# Patient Record
Sex: Female | Born: 1980 | Race: White | Hispanic: No | Marital: Married | Smoking: Never smoker
Health system: Southern US, Community
[De-identification: ages and names within clinical notes are randomized; demographics above are authoritative.]

## PROBLEM LIST (undated history)

## (undated) DIAGNOSIS — M1990 Unspecified osteoarthritis, unspecified site: Secondary | ICD-10-CM

## (undated) DIAGNOSIS — R059 Cough, unspecified: Secondary | ICD-10-CM

## (undated) DIAGNOSIS — E079 Disorder of thyroid, unspecified: Secondary | ICD-10-CM

## (undated) DIAGNOSIS — Z8719 Personal history of other diseases of the digestive system: Secondary | ICD-10-CM

## (undated) DIAGNOSIS — R05 Cough: Secondary | ICD-10-CM

## (undated) HISTORY — DX: Cough, unspecified: R05.9

## (undated) HISTORY — PX: ESOPHAGOGASTRODUODENOSCOPY: SHX1529

## (undated) HISTORY — DX: Personal history of other diseases of the digestive system: Z87.19

## (undated) HISTORY — DX: Cough: R05

## (undated) HISTORY — PX: KNEE SURGERY: SHX244

## (undated) HISTORY — DX: Disorder of thyroid, unspecified: E07.9

## (undated) HISTORY — DX: Unspecified osteoarthritis, unspecified site: M19.90

---
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
## 2003-02-03 ENCOUNTER — Encounter: Payer: Self-pay | Admitting: Pulmonary Disease

## 2004-06-13 ENCOUNTER — Ambulatory Visit: Payer: Self-pay | Admitting: Pulmonary Disease

## 2005-09-07 ENCOUNTER — Encounter: Admission: RE | Admit: 2005-09-07 | Discharge: 2005-09-07 | Payer: Self-pay | Admitting: Family Medicine

## 2006-01-26 ENCOUNTER — Ambulatory Visit: Payer: Self-pay | Admitting: Pulmonary Disease

## 2006-05-23 ENCOUNTER — Ambulatory Visit: Payer: Self-pay | Admitting: Sports Medicine

## 2007-03-26 DIAGNOSIS — J45909 Unspecified asthma, uncomplicated: Secondary | ICD-10-CM | POA: Insufficient documentation

## 2007-03-26 DIAGNOSIS — R059 Cough, unspecified: Secondary | ICD-10-CM | POA: Insufficient documentation

## 2007-03-26 DIAGNOSIS — R05 Cough: Secondary | ICD-10-CM | POA: Insufficient documentation

## 2007-03-27 ENCOUNTER — Ambulatory Visit: Payer: Self-pay | Admitting: Pulmonary Disease

## 2007-04-18 ENCOUNTER — Encounter: Admission: RE | Admit: 2007-04-18 | Discharge: 2007-04-18 | Payer: Self-pay | Admitting: Gastroenterology

## 2007-05-07 ENCOUNTER — Telehealth: Payer: Self-pay | Admitting: Pulmonary Disease

## 2007-06-04 ENCOUNTER — Encounter (INDEPENDENT_AMBULATORY_CARE_PROVIDER_SITE_OTHER): Payer: Self-pay | Admitting: General Surgery

## 2007-06-04 ENCOUNTER — Ambulatory Visit (HOSPITAL_COMMUNITY): Admission: RE | Admit: 2007-06-04 | Discharge: 2007-06-05 | Payer: Self-pay | Admitting: General Surgery

## 2008-05-08 HISTORY — PX: CHOLECYSTECTOMY: SHX55

## 2008-07-27 ENCOUNTER — Ambulatory Visit: Payer: Self-pay | Admitting: Pulmonary Disease

## 2008-07-30 ENCOUNTER — Encounter: Payer: Self-pay | Admitting: Pulmonary Disease

## 2009-07-08 ENCOUNTER — Telehealth (INDEPENDENT_AMBULATORY_CARE_PROVIDER_SITE_OTHER): Payer: Self-pay | Admitting: *Deleted

## 2009-07-15 ENCOUNTER — Telehealth (INDEPENDENT_AMBULATORY_CARE_PROVIDER_SITE_OTHER): Payer: Self-pay | Admitting: *Deleted

## 2009-07-19 ENCOUNTER — Encounter: Payer: Self-pay | Admitting: Pulmonary Disease

## 2009-07-27 ENCOUNTER — Ambulatory Visit: Payer: Self-pay | Admitting: Pulmonary Disease

## 2009-07-27 DIAGNOSIS — J309 Allergic rhinitis, unspecified: Secondary | ICD-10-CM | POA: Insufficient documentation

## 2010-02-14 ENCOUNTER — Telehealth (INDEPENDENT_AMBULATORY_CARE_PROVIDER_SITE_OTHER): Payer: Self-pay | Admitting: *Deleted

## 2010-02-24 ENCOUNTER — Telehealth (INDEPENDENT_AMBULATORY_CARE_PROVIDER_SITE_OTHER): Payer: Self-pay | Admitting: *Deleted

## 2010-06-07 NOTE — Procedures (Signed)
Summary: [facility] [employer]   Imported By: Sherian Rein 07/28/2008 08:55:33  _____________________________________________________________________  External Attachment:    Type:   Image     Comment:   External Document

## 2010-06-07 NOTE — Progress Notes (Signed)
Summary: NEED PRESCRIPT  Phone Note Call from Patient Call back at [phone]   Caller: Patient Call For: [facility] Summary of Call: NEED PRESCRIPT FOR [facility] AND PROVENTIL SENT TO [facility] PATIENT'S CHART HAS BEEN REQUESTED    Initial call taken by: Rickard Patience,  May 07, 2007 11:09 AM  Follow-up for Phone Call        Pt seen 03/27/07.  KC changed advair to symbicort 160/4.5mg . 90 day scripts done awaiting KC signature. Follow-up by: Cloyde Reams RN,  May 07, 2007 4:53 PM  Additional Follow-up for Phone Call Additional follow up Details #1::        Rx sent to [facility] Called pt LMOM rx done. Additional Follow-up by: Cloyde Reams RN,  May 07, 2007 5:28 PM    New/Updated Medications: PROVENTIL HFA 108 (90 BASE) MCG/ACT  AERS (ALBUTEROL SULFATE) Inhale 2 puffs every 4-6 hrs as needed SYMBICORT 160-4.5 MCG/ACT  AERO (BUDESONIDE-FORMOTEROL FUMARATE) Inhale 2 puffs two times a day   Prescriptions: SYMBICORT 160-4.5 MCG/ACT  AERO (BUDESONIDE-FORMOTEROL FUMARATE) Inhale 2 puffs two times a day  #3 x 3   Entered by:   Cloyde Reams RN   Authorized by:   Barbaraann Share MD   Signed by:   Cloyde Reams RN on 05/07/2007   Method used:   Print then Give to Patient   RxID:   [id number] PROVENTIL HFA 108 (90 BASE) MCG/ACT  AERS (ALBUTEROL SULFATE) Inhale 2 puffs every 4-6 hrs as needed  #3 x 3   Entered by:   Cloyde Reams RN   Authorized by:   Barbaraann Share MD   Signed by:   Cloyde Reams RN on 05/07/2007   Method used:   Print then Give to Patient   RxID:   [id number]

## 2010-06-07 NOTE — Assessment & Plan Note (Signed)
Summary: rov for asthma   CC:  Pt is here for a 1 yr f/u appt.  Pt states breathing unchanged from last year.  Pt denied any new complaints. .  History of Present Illness: the pt comes in today for f/u of her known asthma.  She is doing very well on her current regimen, and has had no acute exacerbations.  She denies any need for her rescue inhaler, and is staying active with swimming.  Her allergies always act up this time of year, but she is doing ok on allegra.  Current Medications (verified): 1)  Allegra 180 Mg  Tabs (Fexofenadine Hcl) .... Take One Tab By Mouth Once Daily 2)  Proventil Hfa 108 (90 Base) Mcg/act  Aers (Albuterol Sulfate) .... Inhale 2 Puffs Every 4-6 Hrs As Needed 3)  Symbicort 160-4.5 Mcg/act  Aero (Budesonide-Formoterol Fumarate) .... Inhale 2 Puffs Two Times A Day 4)  Prenatal Multivitamin .... Take 1 Tablet By Mouth Once A Day  Allergies (verified): No Known Drug Allergies  Review of Systems      See HPI  Vital Signs:  Patient profile:   30 year old female Weight:      178 pounds O2 Sat:      97 % on Room air Temp:     98.1 degrees F oral Pulse rate:   66 / minute BP sitting:   112 / 68  (left arm) Cuff size:   regular  Vitals Entered By: Arman Filter LPN (July 27, 2009 8:57 AM)  O2 Flow:  Room air CC: Pt is here for a 1 yr f/u appt.  Pt states breathing unchanged from last year.  Pt denied any new complaints.  Comments Medications reviewed with patient Arman Filter LPN  July 27, 2009 8:57 AM    Physical Exam  General:  wd female in nad Lungs:  totally clear to auscultation, no wheezing or rhonchi Heart:  rrr, no mrg   Impression & Recommendations:  Problem # 1:  ASTHMA (ICD-493.90) the pt is doing well on her current regimen.  No changes at this time  Problem # 2:  ALLERGIC RHINITIS (ICD-477.9)  having increased symptoms this time of year.  Stay on allegra, and can use neilmed sinus rinses as needed.  Medications Added to  Medication List This Visit: 1)  Prenatal Multivitamin  .... Take 1 tablet by mouth once a day  Other Orders: Est. Patient Level II (16109)  Patient Instructions: 1)  no change in meds 2)  followup with me in one year.   Immunization History:  Influenza Immunization History:    Influenza:  historical (02/05/2009)

## 2010-06-07 NOTE — Assessment & Plan Note (Signed)
Summary: rov for asthma   CC:  Pt is here for a f/u appt. Pt was last seen Nov 2008.  Pt states breathing is overall ok.  Pt c/o increased sob while talking.  Pt needs rx for Symbicort and Proventil and Allegra.  .  History of Present Illness: the pt comes in today for f/u of her asthma.  She has been doing well overall, with no recent exacerbations or increased rescue inhaler use.  Her only complaint is that of dyspnea with prolonged conversation.  Her mother will sometime notice this when they are talking on the phone.  She isn't aware whether she is holding her breath during this time.  No significant cough or mucus production.  Medications Prior to Update: 1)  Ortho Evra 150-20 Mcg/24hr  Ptwk (Norelgestromin-Eth Estradiol) .... Take Once Daily 2)  Allegra 180 Mg  Tabs (Fexofenadine Hcl) .... Take One Tab By Mouth Once Daily 3)  Proventil Hfa 108 (90 Base) Mcg/act  Aers (Albuterol Sulfate) .... Inhale 2 Puffs Every 4-6 Hrs As Needed 4)  Symbicort 160-4.5 Mcg/act  Aero (Budesonide-Formoterol Fumarate) .... Inhale 2 Puffs Two Times A Day  Allergies (verified): No Known Drug Allergies  Review of Systems      See HPI  Vital Signs:  Patient profile:   30 year old female Weight:      190.25 pounds BMI:     25.90 O2 Sat:      99 % Temp:     97.7 degrees F oral Pulse rate:   77 / minute BP sitting:   128 / 76  (left arm) Cuff size:   regular  Vitals Entered By: Arman Filter LPN (July 27, 2008 3:46 PM)  O2 Sat on room air at rest %:  99 CC: Pt is here for a f/u appt. Pt was last seen Nov 2008.  Pt states breathing is overall ok.  Pt c/o increased sob while talking.  Pt needs rx for Symbicort, Proventil and Allegra.   Comments Medications reviewed with patient Arman Filter LPN  July 27, 2008 3:56 PM    Physical Exam  General:  wd female in nad Lungs:  clear to auscultation, no wheezing or rhonchi Heart:  rrr, no mrg   Impression & Recommendations:  Problem # 1:  ASTHMA  (ICD-493.90) the pt is doing well from an asthma standpoint.  Her spirometry is normal, and unchanged from 2004.  She exercises regularly, and is getting ready to participate in a triatholon in april.  I suspect her "being winded" with prolonged conversation is not an issue, but I have asked her to monitor to see if worsens.  Complete Medication List: 1)  Ortho Evra 150-20 Mcg/24hr Ptwk (Norelgestromin-eth estradiol) .... Take once daily 2)  Allegra 180 Mg Tabs (Fexofenadine hcl) .... Take one tab by mouth once daily 3)  Proventil Hfa 108 (90 Base) Mcg/act Aers (Albuterol sulfate) .... Inhale 2 puffs every 4-6 hrs as needed 4)  Symbicort 160-4.5 Mcg/act Aero (Budesonide-formoterol fumarate) .... Inhale 2 puffs two times a day  Other Orders: Est. Patient Level II (60454) Spirometry w/Graph (09811)  Patient Instructions: 1)  no change in your pulmonary meds. 2)  follow up with me in one year, but call if issues arise.   Prescriptions: PROVENTIL HFA 108 (90 BASE) MCG/ACT  AERS (ALBUTEROL SULFATE) Inhale 2 puffs every 4-6 hrs as needed  #3 x 6   Entered and Authorized by:   Barbaraann Share MD   Signed by:  Barbaraann Share MD on 07/27/2008   Method used:   Print then Give to Patient   RxID:   [id number] SYMBICORT 160-4.5 MCG/ACT  AERO (BUDESONIDE-FORMOTEROL FUMARATE) Inhale 2 puffs two times a day  #3 x 6   Entered and Authorized by:   Barbaraann Share MD   Signed by:   Barbaraann Share MD on 07/27/2008   Method used:   Print then Give to Patient   RxID:   [id number]   Appended Document: rov for asthma     Allergies: No Known Drug Allergies   Complete Medication List: 1)  Ortho Evra 150-20 Mcg/24hr Ptwk (Norelgestromin-eth estradiol) .... Take once daily 2)  Allegra 180 Mg Tabs (Fexofenadine hcl) .... Take one tab by mouth once daily 3)  Proventil Hfa 108 (90 Base) Mcg/act Aers (Albuterol sulfate) .... Inhale 2 puffs every 4-6 hrs as needed 4)  Symbicort 160-4.5 Mcg/act  Aero (Budesonide-formoterol fumarate) .... Inhale 2 puffs two times a day   Pulmonary Function Test Date: 07/27/2008 Gender: Female  Pre-Spirometry FVC    Value: 5.41 L/min   Pred: 4.44 L/min     % Pred: 121 % FEV1    Value: 4.54 L     Pred: 3.70 L     % Pred: 122 % FEV1/FVC  Value: 84 %     Evaluation: normal

## 2010-06-07 NOTE — Progress Notes (Signed)
Summary: fax coming/ rx/ [facility]-LMTCBx 1   Phone Note Call from Patient   Caller: Patient Call For: clance Summary of Call: pt is faxing a form (to triage fax) for rx FEXOFENADINE HCL. she needs this to be faxed back to [facility] re: pt's continuation of this med. pt's cell [phone] Initial call taken by: Tivis Ringer, CNA,  July 08, 2009 11:04 AM  Follow-up for Phone Call        Spoke with pharmacist at [facility] and initiated PA renewal for fexofenadine 180 mg.  LMOMTCB Vernie Murders  July 08, 2009 11:48 AM  Spoke with pt and made aware that PA has been initiated and tht we just have to wait for [facility] to fill out and fax back. Follow-up by: Vernie Murders,  July 08, 2009 12:01 PM

## 2010-06-07 NOTE — Progress Notes (Signed)
Summary: rx for allegra 90 day supply to [facility]   Phone Note Call from Patient Call back at Work Phone [phone]   Caller: Patient Call For: clance Reason for Call: Refill Medication, Talk to Nurse Summary of Call: Patient needs refill--allegra 180mg --90 day supply--[facility] mail order. Initial call taken by: Lehman Prom,  February 24, 2010 2:23 PM  Follow-up for Phone Call        pt was last seen by [facility] 07/27/2009 and was told to f/u in 1 year.  Pt does have pending appt scheduled for 07/18/2010. Therefore rx sent to [facility].  Pt aware.  Aundra Millet Reynolds LPN  February 24, 2010 3:34 PM     Prescriptions: ALLEGRA 180 MG  TABS (FEXOFENADINE HCL) take one tab by mouth once daily  #90 x 1   Entered by:   Arman Filter LPN   Authorized by:   Barbaraann Share MD   Signed by:   Arman Filter LPN on 62/95/2841   Method used:   Telephoned to ...       [facility] (retail)             ,          Ph: [phone]       Fax: [phone]   RxID:   [id number]

## 2010-06-07 NOTE — Medication Information (Signed)
Summary: Tax adviser   Imported By: Lehman Prom 07/19/2009 16:39:52  _____________________________________________________________________  External Attachment:    Type:   Image     Comment:   External Document

## 2010-06-07 NOTE — Progress Notes (Signed)
Summary: asthma meds/ pregnancy  Phone Note Call from Patient   Caller: Patient Call For: clance Summary of Call: pt believes she is pregnant. stopped taking her asthma meds 3 wks ago. wants to know if it's "safe" to take before resuming meds. [phone] Initial call taken by: Tivis Ringer, CNA,  February 14, 2010 3:09 PM  Follow-up for Phone Call        Called number provied above - [facility] Crystal Yetta Barre RN  February 14, 2010 4:10 PM  Patient is [redacted] weeks pregnant and has stopped her Symbicort and Proventil. Please advise on inhaler use while pregnant. Patient can be reached at [phone] after 5pm.Lori Excell Seltzer [facility]  February 14, 2010 4:35 PM   Additional Follow-up for Phone Call Additional follow up Details #1::        it is ok to stay on her symbicort and albuterol both need to check with obgyn to be sure they are ok with it...but this is not a problem the majority of the time.  Will be important to keep asthma controlled during pregnancy Additional Follow-up by: Barbaraann Share MD,  February 14, 2010 4:39 PM    Additional Follow-up for Phone Call Additional follow up Details #2::    Called, spoke with pt.  She was informed of above per [facility] and verbalized understanding.   Follow-up by: Gweneth Dimitri RN,  February 14, 2010 4:42 PM

## 2010-06-07 NOTE — Progress Notes (Signed)
Summary: fexofenadine PA - LMTCB x 1  Phone Note Outgoing Call   Call placed by: Vernie Murders,  July 15, 2009 4:21 PM Call placed to: Patient Summary of Call: PA for fexofenadine needs to be filled out.  Per KC- we need to call the pt and ask her the ?'s on the PA form (form in in triage).  Per KC if she does not meet criteria, tell pt that her insurance is refusing to pay.  ATC pt LMOMTCB   Will forward to MR per TD Vernie Murders  July 15, 2009 4:30 PM  Initial call taken by: Vernie Murders,  July 15, 2009 4:23 PM  Follow-up for Phone Call        [facility].  Arman Filter LPN  July 15, 2009 4:44 PM   spoke with pt.  completed prior auth form and faxed back to company.   Aundra Millet Reynolds LPN  July 16, 2009 10:23 AM

## 2010-06-07 NOTE — Medication Information (Signed)
Summary: Tax adviser   Imported By: Lehman Prom 07/30/2008 17:19:07  _____________________________________________________________________  External Attachment:    Type:   Image     Comment:   External Document

## 2010-07-10 ENCOUNTER — Emergency Department (HOSPITAL_COMMUNITY): Payer: BC Managed Care – PPO

## 2010-07-10 ENCOUNTER — Emergency Department (HOSPITAL_COMMUNITY)
Admission: EM | Admit: 2010-07-10 | Discharge: 2010-07-10 | Disposition: A | Discharge status: Home / Self Care | Payer: BC Managed Care – PPO | Attending: Emergency Medicine | Admitting: Emergency Medicine

## 2010-07-10 DIAGNOSIS — W010XXA Fall on same level from slipping, tripping and stumbling without subsequent striking against object, initial encounter: Secondary | ICD-10-CM | POA: Insufficient documentation

## 2010-07-10 DIAGNOSIS — S8010XA Contusion of unspecified lower leg, initial encounter: Secondary | ICD-10-CM | POA: Insufficient documentation

## 2010-07-10 DIAGNOSIS — O99891 Other specified diseases and conditions complicating pregnancy: Secondary | ICD-10-CM | POA: Insufficient documentation

## 2010-07-10 DIAGNOSIS — IMO0002 Reserved for concepts with insufficient information to code with codable children: Secondary | ICD-10-CM | POA: Insufficient documentation

## 2010-07-18 ENCOUNTER — Ambulatory Visit (INDEPENDENT_AMBULATORY_CARE_PROVIDER_SITE_OTHER): Payer: BC Managed Care – PPO | Admitting: Pulmonary Disease

## 2010-07-18 ENCOUNTER — Encounter: Payer: Self-pay | Admitting: Pulmonary Disease

## 2010-07-18 DIAGNOSIS — J45909 Unspecified asthma, uncomplicated: Secondary | ICD-10-CM

## 2010-07-19 ENCOUNTER — Telehealth: Payer: Self-pay | Admitting: Pulmonary Disease

## 2010-07-26 NOTE — Progress Notes (Signed)
Summary: QVAR rx to [facility]  Phone Note Call from Patient   Caller: Patient Call For: [facility] Summary of Call: pt was seen yesterday. she requests that the rx for QVAR be faxed to [facility] for 3 months supply. pt # [id number] Initial call taken by: Tivis Ringer, CNA,  July 19, 2010 9:47 AM  Follow-up for Phone Call        LMTCBx1 becasue pt was started on qvar 40 for a few weeks then she was to go to qvar , so I need to know iof she has samples of qvar 40 becasue we cannot send in 3 month supply of the the since she is only to take it for a few weeks. Carron Curie CMA  July 19, 2010 10:12 AM  pt has sample of qvar 40 and was told to use this until it is gone then go to qvar , so refill sent to [facility] for the pt. pt aware. Carron Curie CMA  July 19, 2010 10:44 AM     Prescriptions: QVAR 80 MCG/ACT  AERS (BECLOMETHASONE DIPROPIONATE) Two  puffs twice daily  #3 x 0   Entered by:   Carron Curie CMA   Authorized by:   Barbaraann Share MD   Signed by:   Carron Curie CMA on 07/19/2010   Method used:   Faxed to ...       [facility] (retail)             ,          Ph: [phone]       Fax: [phone]   RxID:   [id number]

## 2010-08-04 NOTE — Assessment & Plan Note (Signed)
Summary: rov for asthma   CC:  1 year f/u appt for asthma.  Pt is currently pregnant and due in May.   Pt states  she stopped taking Symbicort d/t the pregnancy.  Pt states she is currently getting over a "cold" - mostly non productive cough but will occ cough up white sputum. denies fever.  Marland Kitchen  History of Present Illness: the pt comes in today for f/u of her known asthma.  She has been doing very well on her usual regimen, but then became pregnant and stopped symbicort due to concerns about effects to her baby.  She had been doing well off meds, but recently developed a "cold" with persistent mild cough.  She has not had any acute exacerbations.    Preventive Screening-Counseling & Management  Alcohol-Tobacco     Smoking Status: never  Current Medications (verified): 1)  Allegra 180 Mg  Tabs (Fexofenadine Hcl) .... Take One Tab By Mouth Once Daily 2)  Proventil Hfa 108 (90 Base) Mcg/act  Aers (Albuterol Sulfate) .... Inhale 2 Puffs Every 4-6 Hrs As Needed 3)  Prenatal Multivitamin .... Take 1 Tablet By Mouth Once A Day 4)  Wellbutrin .... Once Daily  Allergies (verified): No Known Drug Allergies  Social History: Patient never smoked.  pt is currently pregnany and due in May.   Review of Systems       The patient complains of shortness of breath with activity, shortness of breath at rest, productive cough, non-productive cough, acid heartburn, indigestion, weight change, sore throat, nasal congestion/difficulty breathing through nose, hand/feet swelling, and joint stiffness or pain.  The patient denies coughing up blood, chest pain, irregular heartbeats, loss of appetite, abdominal pain, difficulty swallowing, tooth/dental problems, headaches, sneezing, itching, ear ache, anxiety, depression, rash, change in color of mucus, and fever.    Vital Signs:  Patient profile:   30 year old female Height:      72 inches Weight:      246.25 pounds BMI:     33.52 O2 Sat:      96 % on Room  air Temp:     98.1 degrees F oral Pulse rate:   101 / minute BP sitting:   118 / 76  (left arm) Cuff size:   regular  Vitals Entered By: Arman Filter LPN (July 18, 2010 9:37 AM)  O2 Flow:  Room air CC: 1 year f/u appt for asthma.  Pt is currently pregnant and due in May.   Pt states  she stopped taking Symbicort d/t the pregnancy.  Pt states she is currently getting over a "cold" - mostly non productive cough but will occ cough up white sputum. denies fever.   Comments Medications reviewed with patient Arman Filter LPN  July 18, 2010 9:37 AM    Physical Exam  General:  pregnant female in nad  Lungs:  clear to auscultation  Heart:  mild tachy but regular Extremities:  1+ edema bilat, no cyanosis  Neurologic:  alert and oriented, moves all 4    Impression & Recommendations:  Problem # 1:  ASTHMA (ICD-493.90) the pt has a history of asthma that has done very well in the past on symbicort.  She is now pregnant and concerned about the risk to her baby from the medication.  I have explained to her the medications have been shown to be safe during pregnancy, and that she must weigh the risk of the med vs the risk of having asthma exacerbations late in her pregnancy  or even worse during delivery.  I have convinced her to go back on her meds, but will change to ICS alone rather than combo therapy.  She is willing to do this.  Medications Added to Medication List This Visit: 1)  Wellbutrin  .... Once daily 2)  Qvar 80 Mcg/act Aers (Beclomethasone dipropionate) .... Two  puffs twice daily  Other Orders: Est. Patient Level III (16109)  Patient Instructions: 1)  ok to stay off symbicort 2)  will start you on low dose qvar 2 in am and pm for next few weeks, then start on the higher dose at same dose.  Keep mouth rinsed well.  3)  call if you are having to use your rescue inhaler more than 2-3 times a week 4)  will keep you on qvar for maintenance after delivery to see how you do.    5)  followup with me in one year.    Prescriptions: QVAR 80 MCG/ACT  AERS (BECLOMETHASONE DIPROPIONATE) Two  puffs twice daily  #1 x 11   Entered and Authorized by:   Barbaraann Share MD   Signed by:   Barbaraann Share MD on 07/18/2010   Method used:   Print then Give to Patient   RxID:   [id number]

## 2010-09-12 ENCOUNTER — Other Ambulatory Visit (HOSPITAL_COMMUNITY): Payer: Self-pay | Admitting: Obstetrics and Gynecology

## 2010-09-12 ENCOUNTER — Inpatient Hospital Stay (HOSPITAL_COMMUNITY)
Admission: AD | Admit: 2010-09-12 | Discharge: 2010-09-15 | DRG: 371 | Disposition: A | Discharge status: Home / Self Care | Payer: BC Managed Care – PPO | Source: Ambulatory Visit | Attending: Obstetrics and Gynecology | Admitting: Obstetrics and Gynecology

## 2010-09-12 ENCOUNTER — Inpatient Hospital Stay (HOSPITAL_COMMUNITY): Payer: BC Managed Care – PPO

## 2010-09-12 DIAGNOSIS — O321XX0 Maternal care for breech presentation, not applicable or unspecified: Principal | ICD-10-CM | POA: Diagnosis present

## 2010-09-12 LAB — COMPREHENSIVE METABOLIC PANEL
ALT: 8 U/L (ref 0–35)
AST: 11 U/L (ref 0–37)
Albumin: 2.5 g/dL — ABNORMAL LOW (ref 3.5–5.2)
Alkaline Phosphatase: 138 U/L — ABNORMAL HIGH (ref 39–117)
BUN: 11 mg/dL (ref 6–23)
CO2: 19 mEq/L (ref 19–32)
Calcium: 9.6 mg/dL (ref 8.4–10.5)
Chloride: 98 mEq/L (ref 96–112)
Creatinine, Ser: 0.64 mg/dL (ref 0.4–1.2)
GFR calc Af Amer: >60 mL/min (ref >60)
GFR calc non Af Amer: >60 mL/min (ref >60)
Glucose, Bld: 93 mg/dL (ref 70–99)
Potassium: 4 mEq/L (ref 3.5–5.1)
Sodium: 130 mEq/L — ABNORMAL LOW (ref 135–145)
Total Bilirubin: 0.2 mg/dL — ABNORMAL LOW (ref 0.3–1.2)
Total Protein: 6.8 g/dL (ref 6.0–8.3)

## 2010-09-12 LAB — URINALYSIS, ROUTINE W REFLEX MICROSCOPIC
Bilirubin Urine: NEGATIVE
Glucose, UA: NEGATIVE mg/dL
Hgb urine dipstick: NEGATIVE
Ketones, ur: NEGATIVE mg/dL
Nitrite: NEGATIVE
Protein, ur: NEGATIVE mg/dL
Specific Gravity, Urine: 1.02 (ref 1.005–1.030)
Urobilinogen, UA: 0.2 mg/dL (ref 0.0–1.0)
pH: 6 (ref 5.0–8.0)

## 2010-09-12 LAB — CBC
HCT: 38 % (ref 36.0–46.0)
Hemoglobin: 12.9 g/dL (ref 12.0–15.0)
MCH: 30.1 pg (ref 26.0–34.0)
MCHC: 33.9 g/dL (ref 30.0–36.0)
MCV: 88.8 fL (ref 78.0–100.0)
Platelets: 210 10*3/uL (ref 150–400)
RBC: 4.28 MIL/uL (ref 3.87–5.11)
RDW: 14 % (ref 11.5–15.5)
WBC: 16.3 10*3/uL — ABNORMAL HIGH (ref 4.0–10.5)

## 2010-09-12 LAB — URIC ACID: Uric Acid, Serum: 4.6 mg/dL (ref 2.4–7.0)

## 2010-09-13 ENCOUNTER — Other Ambulatory Visit (HOSPITAL_COMMUNITY): Payer: BC Managed Care – PPO

## 2010-09-13 LAB — CBC
HCT: 33 % — ABNORMAL LOW (ref 36.0–46.0)
Hemoglobin: 10.5 g/dL — ABNORMAL LOW (ref 12.0–15.0)
MCH: 29.2 pg (ref 26.0–34.0)
MCHC: 31.8 g/dL (ref 30.0–36.0)
MCV: 91.7 fL (ref 78.0–100.0)
Platelets: 172 10*3/uL (ref 150–400)
RBC: 3.6 MIL/uL — ABNORMAL LOW (ref 3.87–5.11)
RDW: 14.3 % (ref 11.5–15.5)
WBC: 9.5 10*3/uL (ref 4.0–10.5)

## 2010-09-13 LAB — RPR: RPR Ser Ql: NONREACTIVE

## 2010-09-17 ENCOUNTER — Inpatient Hospital Stay (HOSPITAL_COMMUNITY): Admission: AD | Admit: 2010-09-17 | Payer: Self-pay | Admitting: Obstetrics and Gynecology

## 2010-09-20 ENCOUNTER — Inpatient Hospital Stay (HOSPITAL_COMMUNITY)
Admission: RE | Admit: 2010-09-20 | Payer: BC Managed Care – PPO | Source: Ambulatory Visit | Admitting: Obstetrics and Gynecology

## 2010-09-20 NOTE — Op Note (Signed)
NAMEANDRIANA, CASA                ACCOUNT NO.:  [id number]   MEDICAL RECORD NO.:  [id number]          PATIENT TYPE:  AMB   LOCATION:  DAY                          FACILITY:  [facility]   PHYSICIAN:  Angelia Mould. Derrell Lolling, M.D.DATE OF BIRTH:  1980-05-29   DATE OF PROCEDURE:  06/04/2007  DATE OF DISCHARGE:                               OPERATIVE REPORT   PREOPERATIVE DIAGNOSIS:  Biliary dyskinesia.   POSTOPERATIVE DIAGNOSIS:  Biliary dyskinesia.   OPERATION PERFORMED:  Laparoscopic cholecystectomy with intraoperative  cholangiogram.   SURGEON:  Dr. Claud Kelp.   FIRST ASSISTANT:  Dr. Darnell Level.   OPERATIVE INDICATIONS:  This is a 30 year old white female who has had a  history of nocturnal abdominal pain since August 2008.  This happens two  or three times a week but usually at night after supper.  She has nausea  and vomiting sometimes.  The epigastric and right upper quadrant pain  has persisted.  She has tried proton pump inhibitors and that has not  helped.  She has had upper endoscopy which was normal.  A CLO-test was  negative.  Abdominal ultrasound was normal.  Hepatobiliary scan was  abnormal showing abnormally low ejection fraction at 22% at one hour.  Because of persistent chronic symptoms, she was referred for  consideration of cholecystectomy.   OPERATIVE FINDINGS:  The gallbladder was thin-walled, somewhat  discolored.  The cystic duct was very thin and narrow but was patent.  The cholangiogram was normal showing normal intrahepatic and  extrahepatic biliary anatomy, no filling defects and no obstruction with  good flow of contrast into the duodenum.  The stomach, duodenum, liver,  small intestine, large intestine looked grossly normal to inspection.   OPERATIVE TECHNIQUE:  Following induction of general endotracheal  anesthesia, the patient's abdomen was prepped and draped in sterile  fashion.  Patient was identified as to correct patient and correct  procedure.   Intravenous antibiotics were given.  0.5% Marcaine with  epinephrine used as local infiltration anesthetic.  A vertically  oriented incision was made inside the lower rim of the umbilicus.  The  fascia was incised in midline.  The abdominal cavity entered under  direct vision.  A 10 mm Hasson trocar was inserted and secured with  pursestring suture of 0-0 Vicryl.  Pneumoperitoneum was created.  Video  cam was inserted with visualization findings as described above.  A 10-  mm trocar was placed in the subxiphoid region, two 5 mm trocars placed  in the right midabdomen.  The gallbladder fundus was elevated.  The  infundibulum was identified and retracted laterally.  I dissected out  the cystic duct and cystic artery.  I isolated the cystic artery as it  went onto the wall of the gallbladder, secured with multiple metal clips  and divided it.  This created a large window behind the cystic duct.  The cholangiogram catheter was inserted into the cystic duct.  A  cholangiogram was obtained using the C-arm.  The cholangiogram was  normal as described above.  The cholangiogram catheter was removed.  The  cystic duct was  secured with multiple metal clips and divided.  The  gallbladder dissected from its bed with electrocautery and removed by  placing the specimen bag.  We did spill a little bit of bile from one  hole in the gallbladder but we irrigated all of this out.  At the  completion of the case all of the irrigation fluid was completely clear.  There was no bleeding and no bile be leak whatsoever.  We removed these  5 mm trocars from the right upper quadrant.  One of the trocar sites had  some bleeding and this was cauterized from inside the abdomen and  stopped.  We observed this for 5 minutes and there was no further  bleeding.  The pneumoperitoneum was released.  The remaining trocars  were removed.  The fascia at the umbilicus closed with  0-0 Vicryl sutures.  Skin incisions were closed  with subcuticular  sutures of 4-0 Monocryl and Steri-Strips.  Clean bandages were placed.  The patient was taken recovery room in stable condition.  Estimated  blood loss about 20 mL.  Complications none.  Sponge, needle and  instrument counts were correct.      Angelia Mould. Derrell Lolling, M.D.  Electronically Signed     HMI/MEDQ  D:  06/04/2007  T:  06/05/2007  Job:  [id number]   cc:   Graylin Shiver, M.D.  Fax: [phone]   Duncan Dull, M.D.  Fax: [phone]

## 2010-09-29 NOTE — Op Note (Signed)
  [name], [name]           ACCOUNT NO.:  [id number]  MEDICAL RECORD NO.:  [id number]           PATIENT TYPE:  I  LOCATION:  9133                          FACILITY:  [facility]  PHYSICIAN:  Zelphia Cairo, MD    DATE OF BIRTH:  [birth year]  DATE OF PROCEDURE:  09/12/2010 DATE OF DISCHARGE:                              OPERATIVE REPORT   PREOPERATIVE DIAGNOSES: 1. Intrauterine pregnancy at 37 plus [redacted] weeks gestation. 2. Breech presentation. 3. Repetitive spontaneous fetal heart rate deceleration. 4. Elevated blood pressure.  PROCEDURE:  Primary low transverse cesarean delivery.  ANESTHESIA:  Spinal (Dr. Brayton Caves).  FINDINGS:  Viable female infant with Apgars of 4 and 9, pH 7.15, weight 8 pounds 7 ounces, normal-appearing pelvic anatomy.  URINE OUTPUT:  Clear.  ESTIMATED BLOOD LOSS:  600 mL.  SPECIMEN:  Placenta to pathology.  COMPLICATIONS:  None.  CONDITION:  Stable to recovery room.  DESCRIPTION OF PROCEDURE:  The patient was taken to the operating room where spinal anesthesia was found to be adequate.  She was placed in the supine position with a left tilt, prepped and draped in sterile fashion, and a Foley catheter was inserted sterilely.  Pfannenstiel skin incision was made with a scalpel and extended bluntly using curved Mayo scissors. Peritoneum was then identified and entered sharply.  This was extended superiorly and inferiorly with good visualization of the bladder. Bladder blade was then inserted.  The vesicouterine peritoneum was dissected off of the lower uterine segment using blunt and sharp dissection.  The bladder blade was then repositioned.  The uterine incision was made with a scalpel and extended bluntly using my fingers.  Amniotic membranes were ruptured for clear fluid spontaneously.  Scrotum were noted to be right at the site of the incision and appeared swollen.  The fetus was then delivered using standard breech maneuvers and fundal  pressure.  After delivery, the mouth and nose were suctioned and the cord was clamped and cut and the infant was taken to the awaiting pediatric staff.  Cord gas and cord blood was then collected.  Placenta was then manually removed from the uterus.  The uterus was cleared of all clots and debris using a dry lap sponge and the uterus was reapproximated using double layer closure of zero chromic in a running locked fashion.  The pelvis was then irrigated and the uterine incision was reinspected and again found to be hemostatic.  The peritoneum was reapproximated with zero Monocryl.  The fascia was closed with a looped zero PDS, subcutaneous tissue was closed with plain gut suture, and the skin was closed with 3-0 Monocryl and Dermabond was placed over the skin incision.  The patient was then taken to the recovery room in stable condition.  Sponge, lap, instrument, and needle counts were correct x2.     Zelphia Cairo, MD     GA/MEDQ  D:  09/12/2010  T:  09/12/2010  Job:  [id number]  Electronically Signed by Zelphia Cairo MD on 09/29/2010 09:49:42 AM

## 2010-10-10 NOTE — Discharge Summary (Signed)
[name], [name]           ACCOUNT NO.:  [id number]  MEDICAL RECORD NO.:  [id number]           PATIENT TYPE:  I  LOCATION:  9133                          FACILITY:  [facility]  PHYSICIAN:  Dineen Kid. Rana Snare, M.D.    DATE OF BIRTH:  March 01, 1981  DATE OF ADMISSION:  09/12/2010 DATE OF DISCHARGE:  09/15/2010                              DISCHARGE SUMMARY   ADMITTING DIAGNOSES: 1. Intrauterine pregnancy at 37-6/7th weeks' estimated gestational     age. 2. Baby in breech presentation. 3. Elevation in blood pressure.  DISCHARGE DIAGNOSES: 1. Status post low transverse cesarean section. 2. Viable female infant.  PROCEDURE:  Primary low transverse cesarean section.  REASON FOR ADMISSION:  Please see dictated H and P.  HOSPITAL COURSE:  The patient is a 30 year old primigravida who was admitted to [facility] for cesarean delivery.  The patient had presented to MAU with abdominal pain and blood pressure at that time was noted to be 120s-140s/80s-90s.  During the evaluation while the patient was in an MAU, fetal heart rate decelerated x2 down into the 60s that lasted 6-8 minutes with spontaneous recovery. Biophysical profile revealed score 6/8.  Baby was also known to be in a breech presentation and given the patient's blood pressure, nonreassuring fetal heart tones, decision was made to proceed with a primary low transverse cesarean section.  The patient was then taken to the operating room where spinal anesthesia was administered without difficulty.  A low transverse incision was made with delivery of a viable female infant, weighing 8 pounds and 7 ounces with Apgars of 4 at 1 minute and 9 at 5 minutes.  Arterial cord pH was 7.15.  The patient tolerated the procedure well and was taken to the recovery room in stable condition.  On postoperative day #1, the patient denied headache or blurred vision.  Vital signs were stable.  Blood pressure was 112/74. Abdomen was soft.   Fundus was firm and nontender.  Incision was clean, dry, and intact with Dermabond closure.  Foley had been discontinued. She was voiding well.  Laboratory findings showed hemoglobin of 10.5 and platelet count of 172,000.  On postoperative day #2, the patient did complain that she had not slept well, otherwise vital signs were stable. She was afebrile.  Abdomen was soft.  Fundus was firm and nontender. Ecchymosis was noted superior to the incisional site.  Otherwise, incision was clean, dry, and intact.  On postoperative day #3, the patient denied headache or blurred vision.  Vital signs were stable. Blood pressure was 127/75.  Abdomen was soft.  Fundus was firm and nontender.  Incision was clean, dry, and intact.  Previously noted ecchymosis superior to the incisional site was noted to be decreasing. Discharge instructions were reviewed and the patient was later discharged home.  CONDITION ON DISCHARGE:  Stable.  DIET:  Regular as tolerated.  ACTIVITY:  No heavy lifting, no driving x2 weeks, no vaginal entry.FOLLOWUP:  The patient is to follow up in the office in 1 week for an incision check.  She is to call for temperature greater than 100 degrees, persistent nausea, vomiting, heavy vaginal bleeding, and/or  redness or drainage from an incisional site.  The patient was also instructed to call for headache or blurred vision or feeling jittery.  DISCHARGE MEDICATIONS: 1. Percocet 5/325, #30, 1 p.o. every 4-6 hours p.r.n. 2. Motrin 600 mg every 6 hours. 3. Prenatal vitamins 1 p.o. daily. 4. Colace 1 p.o. daily p.r.n.     Julio Sicks, N.P.   ______________________________ Dineen Kid Rana Snare, M.D.    CC/MEDQ  D:  09/15/2010  T:  09/15/2010  Job:  [id number]  Electronically Signed by Julio Sicks N.P. on 09/16/2010 09:12:21 AM Electronically Signed by Candice Camp M.D. on 10/10/2010 10:44:28 AM

## 2011-01-11 ENCOUNTER — Other Ambulatory Visit: Payer: Self-pay | Admitting: Obstetrics and Gynecology

## 2011-01-27 LAB — COMPREHENSIVE METABOLIC PANEL
ALT: 11
AST: 12
Albumin: 3.4 — ABNORMAL LOW
Alkaline Phosphatase: 63
BUN: 8
CO2: 28
Calcium: 9.2
Chloride: 103
Creatinine, Ser: 0.71
GFR calc Af Amer: >60
GFR calc non Af Amer: >60
Glucose, Bld: 90
Potassium: 4.3
Sodium: 137
Total Bilirubin: 0.4
Total Protein: 6.9

## 2011-01-27 LAB — DIFFERENTIAL
Basophils Absolute: 0
Basophils Relative: 0
Eosinophils Absolute: 0.1
Eosinophils Relative: 1
Lymphocytes Relative: 21
Lymphs Abs: 1.8
Monocytes Absolute: 0.5
Monocytes Relative: 5
Neutro Abs: 6.4
Neutrophils Relative %: 73

## 2011-01-27 LAB — URINALYSIS, ROUTINE W REFLEX MICROSCOPIC
Bilirubin Urine: NEGATIVE
Glucose, UA: NEGATIVE
Ketones, ur: NEGATIVE
Leukocytes, UA: NEGATIVE
Nitrite: NEGATIVE
Protein, ur: NEGATIVE
Specific Gravity, Urine: 1.009
Urobilinogen, UA: 0.2
pH: 6

## 2011-01-27 LAB — CBC
HCT: 36
Hemoglobin: 12.5
MCHC: 34.7
MCV: 84.4
Platelets: 277
RBC: 4.26
RDW: 14.1
WBC: 8.7

## 2011-01-27 LAB — URINE MICROSCOPIC-ADD ON

## 2011-01-27 LAB — PREGNANCY, URINE: Preg Test, Ur: NEGATIVE

## 2011-07-17 ENCOUNTER — Encounter: Payer: Self-pay | Admitting: Pulmonary Disease

## 2011-07-18 ENCOUNTER — Ambulatory Visit (INDEPENDENT_AMBULATORY_CARE_PROVIDER_SITE_OTHER): Payer: BC Managed Care – PPO | Admitting: Pulmonary Disease

## 2011-07-18 ENCOUNTER — Encounter: Payer: Self-pay | Admitting: Pulmonary Disease

## 2011-07-18 VITALS — BP 112/68 | HR 74 | Temp 98.2°F | Ht 72.0 in | Wt 222.4 lb

## 2011-07-18 DIAGNOSIS — J45909 Unspecified asthma, uncomplicated: Secondary | ICD-10-CM

## 2011-07-18 NOTE — Progress Notes (Signed)
  Subjective:    Patient ID: [name], female    DOB: 1981-03-31, 31 y.o.   MRN: [id number]  HPI The patient comes in today for followup of her long-standing asthma.  She unfortunately has not been taking her inhaled corticosteroids on a regular basis, and this is primarily because she "forgets".  She has not had any issues with her asthma, nor has she needed her rescue inhaler.  I talked with her again about the importance of daily inhaled steroid use.   Review of Systems  Constitutional: Negative for fever and unexpected weight change.  HENT: Negative for ear pain, nosebleeds, congestion, sore throat, rhinorrhea, sneezing, trouble swallowing, dental problem, postnasal drip and sinus pressure.   Eyes: Negative for redness and itching.  Respiratory: Negative for cough, chest tightness, shortness of breath and wheezing.   Cardiovascular: Negative for palpitations and leg swelling.  Gastrointestinal: Negative for nausea and vomiting.  Genitourinary: Negative for dysuria.  Musculoskeletal: Negative for joint swelling.  Skin: Negative for rash.  Neurological: Negative for headaches.  Hematological: Does not bruise/bleed easily.  Psychiatric/Behavioral: Negative for dysphoric mood. The patient is not nervous/anxious.        Objective:   Physical Exam Well-developed female in no acute distress Nose without purulence or discharge noted Chest totally clear to auscultation, no wheezes Cardiac exam with regular rate and rhythm Lower extremities without edema, no cyanosis Alert and oriented, moves all 4 extremities.       Assessment & Plan:

## 2011-07-18 NOTE — Patient Instructions (Signed)
You need to get back on qvar on a regular basis to keep airway inflammation controlled.  Would take 2 puffs each am everyday. Continue albuterol as needed for rescue. followup with me in one year if doing well.

## 2011-07-18 NOTE — Assessment & Plan Note (Signed)
The pt is doing very well from an asthma standpoint, but I have asked her to get back on qvar on a regular basis.  I have discussed with her again the pathophysiology of asthma, and the risk of fixed airflow obstruction later in life if we do not keep the inflammation controlled.  She will try to do better with med compliance.

## 2011-08-16 ENCOUNTER — Other Ambulatory Visit: Payer: Self-pay | Admitting: Pulmonary Disease

## 2011-08-16 ENCOUNTER — Telehealth: Payer: Self-pay | Admitting: Pulmonary Disease

## 2011-08-16 MED ORDER — ALBUTEROL SULFATE HFA 108 (90 BASE) MCG/ACT IN AERS: 2.0000 | INHALATION_SPRAY | Freq: Four times a day (QID) | RESPIRATORY_TRACT | Status: DC | PRN

## 2011-08-16 NOTE — Telephone Encounter (Signed)
I spoke with pt and is aware rx was sent to the pharmacy. Nothing further was needed

## 2011-08-16 NOTE — Telephone Encounter (Signed)
lmomtcb x1--[facility]

## 2011-08-16 NOTE — Telephone Encounter (Signed)
Returning call can be reached at [phone].[name]

## 2012-02-27 ENCOUNTER — Other Ambulatory Visit: Payer: Self-pay | Admitting: Pulmonary Disease

## 2012-03-05 ENCOUNTER — Other Ambulatory Visit: Payer: Self-pay | Admitting: *Deleted

## 2012-03-05 MED ORDER — BECLOMETHASONE DIPROPIONATE 80 MCG/ACT IN AERS: 2.0000 | INHALATION_SPRAY | Freq: Two times a day (BID) | RESPIRATORY_TRACT | Status: DC

## 2012-07-17 ENCOUNTER — Encounter: Payer: Self-pay | Admitting: Pulmonary Disease

## 2012-07-17 ENCOUNTER — Ambulatory Visit (INDEPENDENT_AMBULATORY_CARE_PROVIDER_SITE_OTHER): Payer: BC Managed Care – PPO | Admitting: Pulmonary Disease

## 2012-07-17 VITALS — BP 112/82 | HR 91 | Temp 97.3°F | Ht 72.0 in | Wt 187.6 lb

## 2012-07-17 DIAGNOSIS — J45909 Unspecified asthma, uncomplicated: Secondary | ICD-10-CM

## 2012-07-17 NOTE — Assessment & Plan Note (Signed)
The patient is doing very well from an asthma standpoint, however I have stressed to her the importance of staying on her inhaled corticosteroids on a daily basis.  I have reviewed again with her the concept of airway inflammation, as well as airway remodeling leading to fixed airflow obstruction.  The patient will try to stay on her Qvar on a more consistent basis.  I will see her back in one year if she is doing well, and will check spirometry at the next visit.

## 2012-07-17 NOTE — Patient Instructions (Addendum)
Try to stay on qvar 2 puffs each day on a consistent basis. Will see you back in one year, and will check spirometry on your next visit.

## 2012-07-17 NOTE — Progress Notes (Signed)
  Subjective:    Patient ID: OVAL MORALEZ, female    DOB: 1980-11-22, 32 y.o.   MRN: [id number]  HPI The pt comes in today for f/u of her known asthma.  She has been taking her qvar inconsistently, despite asking her to only take once a day.  She has not had any flareups, and has not required her rescue inhaler.  She is fairly active, and feels her breathing is normal.    Review of Systems  Constitutional: Negative for fever and unexpected weight change.  HENT: Positive for congestion, rhinorrhea and postnasal drip. Negative for ear pain, nosebleeds, sore throat, sneezing, trouble swallowing, dental problem and sinus pressure.   Eyes: Negative for redness and itching.  Respiratory: Positive for cough. Negative for chest tightness, shortness of breath and wheezing.   Cardiovascular: Negative for palpitations and leg swelling.  Gastrointestinal: Negative for nausea and vomiting.  Genitourinary: Negative for dysuria.  Musculoskeletal: Negative for joint swelling.  Skin: Negative for rash.  Neurological: Negative for headaches.  Hematological: Does not bruise/bleed easily.  Psychiatric/Behavioral: Negative for dysphoric mood. The patient is not nervous/anxious.        Objective:   Physical Exam Wd female in nad Nose without purulence or discharge noted. Neck without LN or TMG Chest totally clear to auscultation Cor with rrr LE without edema, no cyanosis Alert and oriented, moves all 4.        Assessment & Plan:

## 2013-03-15 ENCOUNTER — Other Ambulatory Visit: Payer: Self-pay | Admitting: Pulmonary Disease

## 2013-03-17 ENCOUNTER — Telehealth: Payer: Self-pay | Admitting: Pulmonary Disease

## 2013-03-17 MED ORDER — ALBUTEROL SULFATE HFA 108 (90 BASE) MCG/ACT IN AERS: 2.0000 | INHALATION_SPRAY | Freq: Four times a day (QID) | RESPIRATORY_TRACT | Status: DC | PRN

## 2013-03-17 NOTE — Telephone Encounter (Signed)
Refills sent and pt is aware. Carron Curie, CMA

## 2013-05-14 ENCOUNTER — Encounter (HOSPITAL_COMMUNITY): Payer: Self-pay | Admitting: *Deleted

## 2013-05-15 MED ORDER — CEFOTETAN DISODIUM 2 G IJ SOLR
2.0000 g | INTRAMUSCULAR | Status: AC
  Administered 2013-05-16: 2 g via INTRAVENOUS
  Filled 2013-05-15: qty 2

## 2013-05-15 NOTE — H&P (Addendum)
33 yo G2P1 with missed Ab presents for surgical mngt  PMHx: asthma, anxiety PSHx: l/s chole, c-section All:  Morphine Meds:  PNV SHx: negative tobacco, etoh and ivdu FHx:  N/c  AF, VSS Gen - NAD CV - RRR Lungs - clear  Abd - soft, NT PV - deferred  US:  Triplet IUP, no FHT x 3.  A/P:  Missed Ab D&E R/b/a discussed, questions answered, informed consent

## 2013-05-16 ENCOUNTER — Encounter (HOSPITAL_COMMUNITY)
Admission: RE | Disposition: A | Discharge status: Home / Self Care | Payer: Self-pay | Source: Ambulatory Visit | Attending: Obstetrics and Gynecology

## 2013-05-16 ENCOUNTER — Encounter (HOSPITAL_COMMUNITY): Payer: BC Managed Care – PPO | Admitting: Anesthesiology

## 2013-05-16 ENCOUNTER — Encounter (HOSPITAL_COMMUNITY): Payer: Self-pay | Admitting: *Deleted

## 2013-05-16 ENCOUNTER — Ambulatory Visit (HOSPITAL_COMMUNITY)
Admission: RE | Admit: 2013-05-16 | Discharge: 2013-05-16 | Disposition: A | Discharge status: Home / Self Care | Payer: BC Managed Care – PPO | Source: Ambulatory Visit | Attending: Obstetrics and Gynecology | Admitting: Obstetrics and Gynecology

## 2013-05-16 ENCOUNTER — Encounter (HOSPITAL_COMMUNITY): Payer: Self-pay | Admitting: Pharmacy Technician

## 2013-05-16 ENCOUNTER — Ambulatory Visit (HOSPITAL_COMMUNITY): Payer: BC Managed Care – PPO | Admitting: Anesthesiology

## 2013-05-16 DIAGNOSIS — O021 Missed abortion: Secondary | ICD-10-CM | POA: Insufficient documentation

## 2013-05-16 DIAGNOSIS — O30109 Triplet pregnancy, unspecified number of placenta and unspecified number of amniotic sacs, unspecified trimester: Secondary | ICD-10-CM | POA: Insufficient documentation

## 2013-05-16 HISTORY — PX: DILATION AND EVACUATION: SHX1459

## 2013-05-16 LAB — CBC
HCT: 40.7 % (ref 36.0–46.0)
Hemoglobin: 14.2 g/dL (ref 12.0–15.0)
MCH: 29.8 pg (ref 26.0–34.0)
MCHC: 34.9 g/dL (ref 30.0–36.0)
MCV: 85.3 fL (ref 78.0–100.0)
Platelets: 229 10*3/uL (ref 150–400)
RBC: 4.77 MIL/uL (ref 3.87–5.11)
RDW: 12.6 % (ref 11.5–15.5)
WBC: 8.2 10*3/uL (ref 4.0–10.5)

## 2013-05-16 SURGERY — DILATION AND EVACUATION, UTERUS
Anesthesia: Monitor Anesthesia Care | Site: Vagina

## 2013-05-16 MED ORDER — PROPOFOL 10 MG/ML IV EMUL
INTRAVENOUS | Status: DC | PRN
  Administered 2013-05-16 (×5): 30 mg via INTRAVENOUS
  Administered 2013-05-16: 20 mg via INTRAVENOUS
  Administered 2013-05-16: 30 mg via INTRAVENOUS
  Administered 2013-05-16: 20 mg via INTRAVENOUS
  Administered 2013-05-16 (×5): 30 mg via INTRAVENOUS

## 2013-05-16 MED ORDER — DEXAMETHASONE SODIUM PHOSPHATE 10 MG/ML IJ SOLN
INTRAMUSCULAR | Status: AC
  Filled 2013-05-16: qty 1

## 2013-05-16 MED ORDER — FENTANYL CITRATE 0.05 MG/ML IJ SOLN
INTRAMUSCULAR | Status: DC | PRN
  Administered 2013-05-16 (×2): 50 ug via INTRAVENOUS

## 2013-05-16 MED ORDER — FENTANYL CITRATE 0.05 MG/ML IJ SOLN
INTRAMUSCULAR | Status: AC
  Filled 2013-05-16: qty 2

## 2013-05-16 MED ORDER — DEXAMETHASONE SODIUM PHOSPHATE 10 MG/ML IJ SOLN
INTRAMUSCULAR | Status: DC | PRN
  Administered 2013-05-16: 10 mg via INTRAVENOUS

## 2013-05-16 MED ORDER — ONDANSETRON HCL 4 MG/2ML IJ SOLN
INTRAMUSCULAR | Status: DC | PRN
  Administered 2013-05-16: 4 mg via INTRAVENOUS

## 2013-05-16 MED ORDER — KETOROLAC TROMETHAMINE 30 MG/ML IJ SOLN: 15.0000 mg | Freq: Once | INTRAMUSCULAR | Status: DC | PRN

## 2013-05-16 MED ORDER — PROPOFOL 10 MG/ML IV EMUL
INTRAVENOUS | Status: AC
  Filled 2013-05-16: qty 20

## 2013-05-16 MED ORDER — MIDAZOLAM HCL 2 MG/2ML IJ SOLN: 0.5000 mg | Freq: Once | INTRAMUSCULAR | Status: DC | PRN

## 2013-05-16 MED ORDER — MEPERIDINE HCL 25 MG/ML IJ SOLN: 6.2500 mg | INTRAMUSCULAR | Status: DC | PRN

## 2013-05-16 MED ORDER — OXYCODONE-ACETAMINOPHEN 5-325 MG PO TABS: 1.0000 | ORAL_TABLET | ORAL | Status: DC | PRN

## 2013-05-16 MED ORDER — FENTANYL CITRATE 0.05 MG/ML IJ SOLN: 25.0000 ug | INTRAMUSCULAR | Status: DC | PRN

## 2013-05-16 MED ORDER — MIDAZOLAM HCL 2 MG/2ML IJ SOLN
INTRAMUSCULAR | Status: DC | PRN
  Administered 2013-05-16: 2 mg via INTRAVENOUS

## 2013-05-16 MED ORDER — ONDANSETRON HCL 4 MG/2ML IJ SOLN
INTRAMUSCULAR | Status: AC
  Filled 2013-05-16: qty 2

## 2013-05-16 MED ORDER — MIDAZOLAM HCL 2 MG/2ML IJ SOLN
INTRAMUSCULAR | Status: AC
  Filled 2013-05-16: qty 2

## 2013-05-16 MED ORDER — KETOROLAC TROMETHAMINE 30 MG/ML IJ SOLN
INTRAMUSCULAR | Status: DC | PRN
  Administered 2013-05-16: 30 mg via INTRAVENOUS

## 2013-05-16 MED ORDER — KETOROLAC TROMETHAMINE 30 MG/ML IJ SOLN
INTRAMUSCULAR | Status: AC
  Filled 2013-05-16: qty 1

## 2013-05-16 MED ORDER — PROMETHAZINE HCL 25 MG/ML IJ SOLN: 6.2500 mg | INTRAMUSCULAR | Status: DC | PRN

## 2013-05-16 MED ORDER — CHLOROPROCAINE HCL 1 % IJ SOLN
INTRAMUSCULAR | Status: DC | PRN
  Administered 2013-05-16: 10 mL

## 2013-05-16 MED ORDER — METHYLERGONOVINE MALEATE 0.2 MG PO TABS: 0.2000 mg | ORAL_TABLET | Freq: Three times a day (TID) | ORAL | Status: DC

## 2013-05-16 MED ORDER — LIDOCAINE HCL (CARDIAC) 20 MG/ML IV SOLN
INTRAVENOUS | Status: AC
  Filled 2013-05-16: qty 5

## 2013-05-16 MED ORDER — CHLOROPROCAINE HCL 1 % IJ SOLN
INTRAMUSCULAR | Status: AC
Start: 2013-05-16 — End: 2013-05-16
  Filled 2013-05-16: qty 30

## 2013-05-16 MED ORDER — LACTATED RINGERS IV SOLN
INTRAVENOUS | Status: DC
  Administered 2013-05-16 (×2): via INTRAVENOUS

## 2013-05-16 SURGICAL SUPPLY — 21 items
CATH ROBINSON RED A/P 16FR (CATHETERS) ×2 IMPLANT
CLOTH BEACON ORANGE TIMEOUT ST (SAFETY) ×2 IMPLANT
DECANTER SPIKE VIAL GLASS SM (MISCELLANEOUS) ×2 IMPLANT
GLOVE BIO SURGEON STRL SZ 6.5 (GLOVE) ×2 IMPLANT
GLOVE BIOGEL PI IND STRL 7.0 (GLOVE) ×1 IMPLANT
GLOVE BIOGEL PI INDICATOR 7.0 (GLOVE) ×1
GOWN STRL REIN XL XLG (GOWN DISPOSABLE) ×4 IMPLANT
KIT BERKELEY 1ST TRIMESTER 3/8 (MISCELLANEOUS) ×2 IMPLANT
NDL SPNL 22GX3.5 QUINCKE BK (NEEDLE) ×1 IMPLANT
NEEDLE SPNL 22GX3.5 QUINCKE BK (NEEDLE) ×2 IMPLANT
NS IRRIG 1000ML POUR BTL (IV SOLUTION) ×2 IMPLANT
PACK VAGINAL MINOR WOMEN LF (CUSTOM PROCEDURE TRAY) ×2 IMPLANT
PAD OB MATERNITY 4.3X12.25 (PERSONAL CARE ITEMS) ×2 IMPLANT
PAD PREP 24X48 CUFFED NSTRL (MISCELLANEOUS) ×2 IMPLANT
SET BERKELEY SUCTION TUBING (SUCTIONS) ×2 IMPLANT
SYR CONTROL 10ML LL (SYRINGE) ×2 IMPLANT
TOWEL OR 17X24 6PK STRL BLUE (TOWEL DISPOSABLE) ×4 IMPLANT
VACURETTE 10 RIGID CVD (CANNULA) IMPLANT
VACURETTE 7MM CVD STRL WRAP (CANNULA) IMPLANT
VACURETTE 8 RIGID CVD (CANNULA) IMPLANT
VACURETTE 9 RIGID CVD (CANNULA) IMPLANT

## 2013-05-16 NOTE — Transfer of Care (Signed)
Immediate Anesthesia Transfer of Care Note  Patient: Melanie LainMelanie J Fahey  Procedure(s) Performed: Procedure(s): DILATATION AND EVACUATION (N/A)  Patient Location: PACU  Anesthesia Type:MAC  Level of Consciousness: awake, alert  and oriented  Airway & Oxygen Therapy: Patient Spontanous Breathing and Patient connected to nasal cannula oxygen  Post-op Assessment: Report given to PACU RN, Post -op Vital signs reviewed and stable and Patient moving all extremities  Post vital signs: Reviewed and stable  Complications: No apparent anesthesia complications

## 2013-05-16 NOTE — Preoperative (Signed)
Beta Blockers   Reason not to administer Beta Blockers:Not Applicable 

## 2013-05-16 NOTE — Anesthesia Preprocedure Evaluation (Signed)
Anesthesia Evaluation  Patient identified by MRN, date of birth, ID band Patient awake    Reviewed: Allergy & Precautions, H&P , Patient's Chart, lab work & pertinent test results, reviewed documented beta blocker date and time   History of Anesthesia Complications Negative for: history of anesthetic complications  Airway Mallampati: II TM Distance: >3 FB Neck ROM: full    Dental   Pulmonary asthma ,  breath sounds clear to auscultation        Cardiovascular Exercise Tolerance: Good Rhythm:regular Rate:Normal     Neuro/Psych negative psych ROS   GI/Hepatic   Endo/Other    Renal/GU      Musculoskeletal   Abdominal   Peds  Hematology   Anesthesia Other Findings   Reproductive/Obstetrics                           Anesthesia Physical Anesthesia Plan  ASA: I  Anesthesia Plan: MAC   Post-op Pain Management:    Induction:   Airway Management Planned:   Additional Equipment:   Intra-op Plan:   Post-operative Plan:   Informed Consent: I have reviewed the patients History and Physical, chart, labs and discussed the procedure including the risks, benefits and alternatives for the proposed anesthesia with the patient or authorized representative who has indicated his/her understanding and acceptance.   Dental Advisory Given  Plan Discussed with: CRNA, Surgeon and Anesthesiologist  Anesthesia Plan Comments:         Anesthesia Quick Evaluation

## 2013-05-16 NOTE — Anesthesia Postprocedure Evaluation (Signed)
  Anesthesia Post Note  Patient: [name]  Procedure(s) Performed: Procedure(s) (LRB): DILATATION AND EVACUATION (N/A)  Anesthesia type: MAC  Patient location: PACU  Post pain: Pain level controlled  Post assessment: Post-op Vital signs reviewed  Last Vitals:  Filed Vitals:   05/16/13 1449  BP: 121/59  Pulse: 68  Temp: 36.7 C  Resp: 20    Post vital signs: Reviewed  Level of consciousness: sedated  Complications: No apparent anesthesia complications

## 2013-05-16 NOTE — Discharge Instructions (Signed)
FU office 2-3 weeks for postop appointment.  Call the office [phone] for an appointment.  May take ibuprofen after 8:40 p.m. As needed for pain/cramps  Personal Hygiene: Use pads not tampons x 1week You may shower, no tub baths or pools for 2-3 weeks Wipe from front to back when using restroom  Activity: Do not drive or operate any equipment for 24 hrs.   Do not rest in bed all day Walking is encouraged Walk up and down stairs slowly You may return to your normal activity in 1-2 days  Sexual Activity:  No intercourse for 2 weeks after the procedure.  Diet: Eat a light meal as desired this evening.  You may resume your usual diet tomorrow.  Return to work:  You may resume your work activities after 1-2 days  What to expect:  Expect to have vaginal bleeding/discharge for 2-3 days and spotting for 10-14 days.  It is not unusual to have soreness for 1-2 weeks.  You may have a slight burning sensation when you urinate for the first few days.  You may start your menses in 2-6 weeks.  Mild cramps may continue for a couple of days.    Call your doctor:   Excessive bleeding, saturating a pad every hour Inability to urinate 6 hours after discharge Pain not relieved with pain medications Fever of 100.4 or greater

## 2013-05-19 ENCOUNTER — Encounter (HOSPITAL_COMMUNITY): Payer: Self-pay | Admitting: Obstetrics and Gynecology

## 2013-05-19 NOTE — Op Note (Signed)
NAMColonel Bald:  Agrawal, Mulan           ACCOUNT NO.:  [id number]  MEDICAL RECORD NO.:  [id number]  LOCATION:  [facility]                          FACILITY:  [facility]  PHYSICIAN:  Zelphia CairoGretchen Capone Schwinn, MD    DATE OF BIRTH:  Aug 25, 1980  DATE OF PROCEDURE: DATE OF DISCHARGE:  05/16/2013                              OPERATIVE REPORT   PREOPERATIVE DIAGNOSES: 1. Intrauterine pregnancy. 2. Missed abortion.  POSTOPERATIVE DIAGNOSES: 1. Intrauterine pregnancy. 2. Missed abortion.  PROCEDURE: 1. Cervical block. 2. Dilation and evacuation.  SURGEON:  Zelphia CairoGretchen Gahel Safley, MD  ANESTHESIA:  MAC with local.  COMPLICATIONS:  None.  CONDITION:  Stable to recovery room.  PROCEDURE:  The patient was taken to the operating room.  After informed consent was obtained, she was given anesthesia and placed in the dorsal lithotomy position using Allen stirrups.  She was prepped and draped in sterile fashion.  Bivalve speculum was placed in the vagina and 1 mL of Nesacaine was injected at the anterior lip of the cervix.  Single-tooth tenaculum was attached to the anterior lip of the cervix.  The remaining 9 mL was used to perform a cervical block.  The cervix was serially dilated with Shawnie PonsPratt dilators.  An 8-French suction catheter was inserted and products of conception were evacuated from the uterus.  A gentle curetting was then performed until an uterine cry was noted throughout. Suction catheter was reinserted one last time to remove any clots and debris.  Tenaculum was removed from the cervix.  The cervix was hemostatic.  Speculum was removed.  The patient was taken to the recovery room in stable condition.  Sponge, lap, needle, and instrument counts were correct x2.    Zelphia CairoGretchen Mayur Duman, MD    GA/MEDQ  D:  05/18/2013  T:  05/19/2013  Job:  [id number]

## 2013-07-18 ENCOUNTER — Encounter (INDEPENDENT_AMBULATORY_CARE_PROVIDER_SITE_OTHER): Payer: Self-pay

## 2013-07-18 ENCOUNTER — Ambulatory Visit (INDEPENDENT_AMBULATORY_CARE_PROVIDER_SITE_OTHER): Payer: BC Managed Care – PPO | Admitting: Pulmonary Disease

## 2013-07-18 ENCOUNTER — Encounter: Payer: Self-pay | Admitting: Pulmonary Disease

## 2013-07-18 VITALS — BP 118/72 | HR 77 | Temp 98.1°F | Ht 72.0 in | Wt 194.6 lb

## 2013-07-18 DIAGNOSIS — J45909 Unspecified asthma, uncomplicated: Secondary | ICD-10-CM

## 2013-07-18 NOTE — Patient Instructions (Signed)
Stay on your qvar 2 puffs twice a day.  Let us know if you are requiring your albuterol more than twice a week. Finish up your antibiotic, and call if you are not getting back to baseline. Can try pseudoephedrine, 30-60mg  every 4-6hours if needed.  Also comes in sustained release 120mg  every 12 hours.  followup with me again in one year.

## 2013-07-18 NOTE — Progress Notes (Signed)
   Subjective:    Patient ID: [name], female    DOB: 1980/11/01, 33 y.o.   MRN: [id number]  HPI The patient comes in today for followup of her known asthma. She has been doing well overall, but has not stayed on her Qvar every day. She has had a recent sinus infection for which she is completing a ten-day course of Augmentin, but has not had an acute exacerbation. She continues to have ear pressure and sinus congestion, but is not taking a decongestant. She has used a Marketing executiveettie Pot without success.  She has some cough with discolored mucus but it is not significant.   Review of Systems  Constitutional: Positive for fever. Negative for unexpected weight change.  HENT: Positive for congestion and sinus pressure. Negative for dental problem, ear pain, nosebleeds, postnasal drip, rhinorrhea, sneezing, sore throat and trouble swallowing.   Eyes: Negative for redness and itching.  Respiratory: Positive for cough. Negative for chest tightness, shortness of breath and wheezing.   Cardiovascular: Negative for palpitations and leg swelling.  Gastrointestinal: Negative for nausea and vomiting.  Genitourinary: Negative for dysuria.  Musculoskeletal: Negative for joint swelling.  Skin: Negative for rash.  Neurological: Negative for headaches.  Hematological: Does not bruise/bleed easily.  Psychiatric/Behavioral: Negative for dysphoric mood. The patient is not nervous/anxious.        Objective:   Physical Exam Well-developed female in no acute distress Nose without purulence or discharge noted Neck without lymphadenopathy or thyromegaly Chest totally clear to auscultation, no wheezing Cardiac exam with regular rate and rhythm Lower extremities without edema, no cyanosis Alert and oriented, moves all 4 extremities.       Assessment & Plan:

## 2013-07-18 NOTE — Assessment & Plan Note (Signed)
The patient is doing well overall from an asthma standpoint, but I have stressed to her the importance of staying on her Qvar every day. She is getting over a sinus infection, and I have asked her to try a decongestant to see if this will help with clearing of her passages. She is to call if she is not improving back to baseline. She was supposed to have spirometry at this visit, but will hold off since she is getting over an infection. Will check this at the next visit.

## 2013-10-01 LAB — OB RESULTS CONSOLE RUBELLA ANTIBODY, IGM: Rubella: IMMUNE

## 2013-10-01 LAB — OB RESULTS CONSOLE GC/CHLAMYDIA
Chlamydia: NEGATIVE
Gonorrhea: NEGATIVE

## 2013-10-01 LAB — OB RESULTS CONSOLE ABO/RH: RH Type: POSITIVE

## 2013-10-01 LAB — OB RESULTS CONSOLE HEPATITIS B SURFACE ANTIGEN: Hepatitis B Surface Ag: NEGATIVE

## 2013-10-01 LAB — OB RESULTS CONSOLE HIV ANTIBODY (ROUTINE TESTING): HIV: NONREACTIVE

## 2013-10-01 LAB — OB RESULTS CONSOLE RPR: RPR: NONREACTIVE

## 2013-10-01 LAB — OB RESULTS CONSOLE ANTIBODY SCREEN: Antibody Screen: NEGATIVE

## 2014-03-09 ENCOUNTER — Encounter: Payer: Self-pay | Admitting: Pulmonary Disease

## 2014-04-10 LAB — OB RESULTS CONSOLE GBS: GBS: NEGATIVE

## 2014-05-11 ENCOUNTER — Encounter (HOSPITAL_COMMUNITY): Payer: Self-pay | Admitting: *Deleted

## 2014-05-11 ENCOUNTER — Inpatient Hospital Stay (HOSPITAL_COMMUNITY)
Admission: AD | Admit: 2014-05-11 | Discharge: 2014-05-15 | DRG: 766 | Disposition: A | Discharge status: Home / Self Care | Payer: BLUE CROSS/BLUE SHIELD | Source: Ambulatory Visit | Attending: Obstetrics and Gynecology | Admitting: Obstetrics and Gynecology

## 2014-05-11 DIAGNOSIS — O3421 Maternal care for scar from previous cesarean delivery: Secondary | ICD-10-CM | POA: Diagnosis present

## 2014-05-11 DIAGNOSIS — Z3A40 40 weeks gestation of pregnancy: Secondary | ICD-10-CM | POA: Diagnosis present

## 2014-05-11 DIAGNOSIS — O3663X0 Maternal care for excessive fetal growth, third trimester, not applicable or unspecified: Secondary | ICD-10-CM | POA: Diagnosis present

## 2014-05-11 DIAGNOSIS — Z98891 History of uterine scar from previous surgery: Secondary | ICD-10-CM

## 2014-05-11 DIAGNOSIS — IMO0002 Reserved for concepts with insufficient information to code with codable children: Secondary | ICD-10-CM | POA: Diagnosis present

## 2014-05-11 DIAGNOSIS — O34219 Maternal care for unspecified type scar from previous cesarean delivery: Secondary | ICD-10-CM

## 2014-05-11 DIAGNOSIS — O09523 Supervision of elderly multigravida, third trimester: Secondary | ICD-10-CM

## 2014-05-11 DIAGNOSIS — O480 Post-term pregnancy: Secondary | ICD-10-CM | POA: Diagnosis present

## 2014-05-11 LAB — CBC
HCT: 34.2 % — ABNORMAL LOW (ref 36.0–46.0)
Hemoglobin: 11.6 g/dL — ABNORMAL LOW (ref 12.0–15.0)
MCH: 29.1 pg (ref 26.0–34.0)
MCHC: 33.9 g/dL (ref 30.0–36.0)
MCV: 85.9 fL (ref 78.0–100.0)
Platelets: 171 10*3/uL (ref 150–400)
RBC: 3.98 MIL/uL (ref 3.87–5.11)
RDW: 13.9 % (ref 11.5–15.5)
WBC: 14.4 10*3/uL — ABNORMAL HIGH (ref 4.0–10.5)

## 2014-05-11 LAB — COMPREHENSIVE METABOLIC PANEL
ALT: 11 U/L (ref 0–35)
AST: 24 U/L (ref 0–37)
Albumin: 2.6 g/dL — ABNORMAL LOW (ref 3.5–5.2)
Alkaline Phosphatase: 131 U/L — ABNORMAL HIGH (ref 39–117)
Anion gap: 12 (ref 5–15)
BUN: 12 mg/dL (ref 6–23)
CO2: 21 mmol/L (ref 19–32)
Calcium: 8.9 mg/dL (ref 8.4–10.5)
Chloride: 103 mEq/L (ref 96–112)
Creatinine, Ser: 0.7 mg/dL (ref 0.50–1.10)
GFR calc Af Amer: >90 mL/min (ref >90)
GFR calc non Af Amer: >90 mL/min (ref >90)
Glucose, Bld: 119 mg/dL — ABNORMAL HIGH (ref 70–99)
Potassium: 3.9 mmol/L (ref 3.5–5.1)
Sodium: 136 mmol/L (ref 135–145)
Total Bilirubin: 0.3 mg/dL (ref 0.3–1.2)
Total Protein: 5.8 g/dL — ABNORMAL LOW (ref 6.0–8.3)

## 2014-05-11 LAB — URINALYSIS, ROUTINE W REFLEX MICROSCOPIC
Bilirubin Urine: NEGATIVE
Glucose, UA: NEGATIVE mg/dL
Ketones, ur: NEGATIVE mg/dL
Leukocytes, UA: NEGATIVE
Nitrite: NEGATIVE
Protein, ur: 100 mg/dL — AB
Specific Gravity, Urine: >1.03 — ABNORMAL HIGH (ref 1.005–1.030)
Urobilinogen, UA: 0.2 mg/dL (ref 0.0–1.0)
pH: 6.5 (ref 5.0–8.0)

## 2014-05-11 LAB — LACTATE DEHYDROGENASE: LDH: 147 U/L (ref 94–250)

## 2014-05-11 LAB — URINE MICROSCOPIC-ADD ON

## 2014-05-11 LAB — URIC ACID: Uric Acid, Serum: 5.8 mg/dL (ref 2.4–7.0)

## 2014-05-11 MED ORDER — FENTANYL CITRATE 0.05 MG/ML IJ SOLN: 100.0000 ug | INTRAMUSCULAR | Status: DC | PRN

## 2014-05-11 MED ORDER — FLEET ENEMA 7-19 GM/118ML RE ENEM: 1.0000 | ENEMA | RECTAL | Status: DC | PRN

## 2014-05-11 MED ORDER — ONDANSETRON HCL 4 MG/2ML IJ SOLN: 4.0000 mg | Freq: Four times a day (QID) | INTRAMUSCULAR | Status: DC | PRN

## 2014-05-11 MED ORDER — ZOLPIDEM TARTRATE 5 MG PO TABS: 5.0000 mg | ORAL_TABLET | Freq: Every evening | ORAL | Status: DC | PRN

## 2014-05-11 MED ORDER — OXYTOCIN 40 UNITS IN LACTATED RINGERS INFUSION - SIMPLE MED: 62.5000 mL/h | INTRAVENOUS | Status: DC

## 2014-05-11 MED ORDER — TERBUTALINE SULFATE 1 MG/ML IJ SOLN: 0.2500 mg | Freq: Once | INTRAMUSCULAR | Status: AC | PRN

## 2014-05-11 MED ORDER — LACTATED RINGERS IV SOLN: 500.0000 mL | INTRAVENOUS | Status: DC | PRN

## 2014-05-11 MED ORDER — BUTORPHANOL TARTRATE 1 MG/ML IJ SOLN: 1.0000 mg | INTRAMUSCULAR | Status: DC | PRN

## 2014-05-11 MED ORDER — ACETAMINOPHEN 325 MG PO TABS: 650.0000 mg | ORAL_TABLET | ORAL | Status: DC | PRN

## 2014-05-11 MED ORDER — LIDOCAINE HCL (PF) 1 % IJ SOLN: 30.0000 mL | INTRAMUSCULAR | Status: DC | PRN

## 2014-05-11 MED ORDER — OXYTOCIN BOLUS FROM INFUSION: 500.0000 mL | INTRAVENOUS | Status: DC

## 2014-05-11 MED ORDER — CITRIC ACID-SODIUM CITRATE 334-500 MG/5ML PO SOLN
30.0000 mL | ORAL | Status: DC | PRN
  Administered 2014-05-12: 30 mL via ORAL
  Filled 2014-05-11: qty 15

## 2014-05-11 MED ORDER — LACTATED RINGERS IV SOLN
INTRAVENOUS | Status: DC
  Administered 2014-05-11 – 2014-05-12 (×4): via INTRAVENOUS

## 2014-05-11 MED ORDER — OXYTOCIN 40 UNITS IN LACTATED RINGERS INFUSION - SIMPLE MED
1.0000 m[IU]/min | INTRAVENOUS | Status: DC
  Administered 2014-05-12: 1 m[IU]/min via INTRAVENOUS
  Filled 2014-05-11: qty 1000

## 2014-05-11 NOTE — H&P (Signed)
Kylee Umana Siefert is a 34 y.o. female presenting for post-dates induction.  Patient has h/o previous C/S and desires trial of labor.  Last EFW 10#13 with high normal AFI.  The patient has been counseled multiple times regarding risk of uterine rupture as well as recommendation for repeat C/S.  Antepartum course complicated by AMA, asthma, h/o postpartum depression.  GBS negative.  No HA, CP/SOB, RUQ pain, or vision change.   Maternal Medical History:  Fetal activity: Perceived fetal activity is normal.   Last perceived fetal movement was within the past hour.    Prenatal complications: no prenatal complications Prenatal Complications - Diabetes: none.    OB History    Gravida Para Term Preterm AB TAB SAB Ectopic Multiple Living   0 1     Past Medical History  Diagnosis Date  . Allergic rhinitis   . Cough   . Asthma    Past Surgical History  Procedure Laterality Date  . Cesarean section classical  2012  . Cholecystectomy  2010  . Wisdom tooth extraction  1997  . Dilation and evacuation N/A 05/16/2013    Procedure: DILATATION AND EVACUATION;  Surgeon: Zelphia Cairo, MD;  Location: [facility];  Service: Gynecology;  Laterality: N/A;   Family History: family history is not on file. Social History:  reports that she has never smoked. She has never used smokeless tobacco. She reports that she does not drink alcohol or use illicit drugs.   Prenatal Transfer Tool  Maternal Diabetes: No Genetic Screening: Normal Maternal Ultrasounds/Referrals: Normal Fetal Ultrasounds or other Referrals:  None Maternal Substance Abuse:  No Significant Maternal Medications:  None Significant Maternal Lab Results:  Lab values include: Group B Strep negative Other Comments:  None  ROS  Dilation: 1 Exam by:: Dr Langston Masker Blood pressure 149/89, pulse 87, temperature 97.8 F (36.6 C), temperature source Oral, resp. rate 18, last menstrual period 08/01/2013, unknown if currently  breastfeeding. Maternal Exam:  Uterine Assessment: Contraction strength is mild.  Contraction frequency is rare.   Abdomen: Patient reports no abdominal tenderness. Surgical scars: low transverse.   Fundal height is c/w dates.   Estimated fetal weight is 10#13.   Fetal presentation: vertex  Introitus: Normal vulva. Pelvis: questionable for delivery.   Cervix: Cervix evaluated by digital exam.     Physical Exam  Constitutional: She is oriented to person, place, and time. She appears well-developed and well-nourished.  GI: Soft. There is no rebound and no guarding.  Neurological: She is alert and oriented to person, place, and time.  Skin: Skin is warm and dry.  Psychiatric: She has a normal mood and affect. Her behavior is normal.    Prenatal labs: ABO, Rh:   Antibody:   Rubella:   RPR:    HBsAg:    HIV:    GBS:     Assessment/Plan: 33yo G3P1011 at [redacted]w[redacted]d for induction/trial of labor Patient counseled again regarding risk of uterine rupture including STAT C/S, fetomaternal hemorrhage, and death.  She also understands the risk of shoulder dystocia with maternal and fetal trauma.  All questions were answered and she wishes to proceed.  Will start low dose pitocin.   Aziah Kaiser 05/11/2014, 9:50 PM

## 2014-05-11 NOTE — MAU Note (Signed)
Pt was told by doctor to come to MAU between 1900 and 1930 this evening. Patient does not know why she is here tonight but states that she has a large baby and was told to think about a repeat caesarean section with this baby. Pt had a c-section with the first baby because of size and the inability to properly monitor baby according to patient.

## 2014-05-12 ENCOUNTER — Inpatient Hospital Stay (HOSPITAL_COMMUNITY): Payer: BLUE CROSS/BLUE SHIELD | Admitting: Anesthesiology

## 2014-05-12 ENCOUNTER — Encounter (HOSPITAL_COMMUNITY)
Admission: AD | Disposition: A | Discharge status: Home / Self Care | Payer: Self-pay | Source: Ambulatory Visit | Attending: Obstetrics and Gynecology

## 2014-05-12 ENCOUNTER — Encounter (HOSPITAL_COMMUNITY): Payer: Self-pay | Admitting: *Deleted

## 2014-05-12 DIAGNOSIS — O34219 Maternal care for unspecified type scar from previous cesarean delivery: Secondary | ICD-10-CM

## 2014-05-12 LAB — TYPE AND SCREEN
ABO/RH(D): O POS
Antibody Screen: NEGATIVE

## 2014-05-12 LAB — ABO/RH: ABO/RH(D): O POS

## 2014-05-12 LAB — RPR

## 2014-05-12 SURGERY — Surgical Case
Anesthesia: Spinal

## 2014-05-12 MED ORDER — MORPHINE SULFATE 0.5 MG/ML IJ SOLN
INTRAMUSCULAR | Status: AC
  Filled 2014-05-12: qty 10

## 2014-05-12 MED ORDER — NALBUPHINE HCL 10 MG/ML IJ SOLN: 5.0000 mg | INTRAMUSCULAR | Status: DC | PRN

## 2014-05-12 MED ORDER — DIPHENHYDRAMINE HCL 12.5 MG/5ML PO ELIX
12.5000 mg | ORAL_SOLUTION | Freq: Four times a day (QID) | ORAL | Status: DC | PRN
  Filled 2014-05-12: qty 5

## 2014-05-12 MED ORDER — DIPHENHYDRAMINE HCL 12.5 MG/5ML PO ELIX: 12.5000 mg | ORAL_SOLUTION | Freq: Four times a day (QID) | ORAL | Status: DC | PRN

## 2014-05-12 MED ORDER — SODIUM CHLORIDE 0.9 % IJ SOLN: 3.0000 mL | INTRAMUSCULAR | Status: DC | PRN

## 2014-05-12 MED ORDER — DIBUCAINE 1 % RE OINT: 1.0000 "application " | TOPICAL_OINTMENT | RECTAL | Status: DC | PRN

## 2014-05-12 MED ORDER — FENTANYL CITRATE 0.05 MG/ML IJ SOLN
INTRAMUSCULAR | Status: DC
Start: 2014-05-12 — End: 2014-05-13
  Filled 2014-05-12: qty 2

## 2014-05-12 MED ORDER — SCOPOLAMINE 1 MG/3DAYS TD PT72
1.0000 | MEDICATED_PATCH | Freq: Once | TRANSDERMAL | Status: DC
  Administered 2014-05-12: 1.5 mg via TRANSDERMAL

## 2014-05-12 MED ORDER — SCOPOLAMINE 1 MG/3DAYS TD PT72
MEDICATED_PATCH | TRANSDERMAL | Status: AC
  Filled 2014-05-12: qty 1

## 2014-05-12 MED ORDER — ONDANSETRON HCL 4 MG/2ML IJ SOLN
INTRAMUSCULAR | Status: AC
  Filled 2014-05-12: qty 2

## 2014-05-12 MED ORDER — MENTHOL 3 MG MT LOZG: 1.0000 | LOZENGE | OROMUCOSAL | Status: DC | PRN

## 2014-05-12 MED ORDER — ONDANSETRON HCL 4 MG PO TABS: 4.0000 mg | ORAL_TABLET | ORAL | Status: DC | PRN

## 2014-05-12 MED ORDER — DIPHENHYDRAMINE HCL 25 MG PO CAPS: 25.0000 mg | ORAL_CAPSULE | Freq: Four times a day (QID) | ORAL | Status: DC | PRN

## 2014-05-12 MED ORDER — NALOXONE HCL 0.4 MG/ML IJ SOLN
0.4000 mg | INTRAMUSCULAR | Status: DC | PRN
Start: 2014-05-12 — End: 2014-05-15

## 2014-05-12 MED ORDER — ONDANSETRON HCL 4 MG/2ML IJ SOLN
INTRAMUSCULAR | Status: DC | PRN
  Administered 2014-05-12: 4 mg via INTRAVENOUS

## 2014-05-12 MED ORDER — FENTANYL CITRATE 0.05 MG/ML IJ SOLN
INTRAMUSCULAR | Status: AC
  Filled 2014-05-12: qty 2

## 2014-05-12 MED ORDER — NALBUPHINE HCL 10 MG/ML IJ SOLN: 5.0000 mg | Freq: Once | INTRAMUSCULAR | Status: AC | PRN

## 2014-05-12 MED ORDER — ALBUTEROL SULFATE (2.5 MG/3ML) 0.083% IN NEBU: 3.0000 mL | INHALATION_SOLUTION | Freq: Four times a day (QID) | RESPIRATORY_TRACT | Status: DC | PRN

## 2014-05-12 MED ORDER — KETOROLAC TROMETHAMINE 30 MG/ML IJ SOLN
30.0000 mg | Freq: Once | INTRAMUSCULAR | Status: AC
  Administered 2014-05-12: 30 mg via INTRAMUSCULAR

## 2014-05-12 MED ORDER — OXYCODONE-ACETAMINOPHEN 5-325 MG PO TABS
2.0000 | ORAL_TABLET | ORAL | Status: DC | PRN
  Administered 2014-05-13 – 2014-05-14 (×3): 2 via ORAL
  Filled 2014-05-12 (×3): qty 2

## 2014-05-12 MED ORDER — LACTATED RINGERS IV SOLN
INTRAVENOUS | Status: DC | PRN
  Administered 2014-05-12: 17:00:00 via INTRAVENOUS

## 2014-05-12 MED ORDER — FENTANYL CITRATE 0.05 MG/ML IJ SOLN
25.0000 ug | INTRAMUSCULAR | Status: DC | PRN
  Administered 2014-05-12: 50 ug via INTRAVENOUS

## 2014-05-12 MED ORDER — OXYCODONE-ACETAMINOPHEN 5-325 MG PO TABS
1.0000 | ORAL_TABLET | ORAL | Status: DC | PRN
  Administered 2014-05-13 – 2014-05-14 (×3): 1 via ORAL
  Filled 2014-05-12 (×3): qty 1

## 2014-05-12 MED ORDER — KETOROLAC TROMETHAMINE 30 MG/ML IJ SOLN
30.0000 mg | Freq: Four times a day (QID) | INTRAMUSCULAR | Status: DC | PRN
  Administered 2014-05-13: 30 mg via INTRAVENOUS
  Filled 2014-05-12: qty 1

## 2014-05-12 MED ORDER — DEXTROSE 5 % IV SOLN
3.0000 g | Freq: Once | INTRAVENOUS | Status: AC
  Administered 2014-05-12: 3 g via INTRAVENOUS
  Filled 2014-05-12: qty 3000

## 2014-05-12 MED ORDER — FENTANYL BOLUS VIA INFUSION
50.0000 ug | INTRAVENOUS | Status: DC | PRN
  Filled 2014-05-12: qty 50

## 2014-05-12 MED ORDER — KETOROLAC TROMETHAMINE 30 MG/ML IJ SOLN
INTRAMUSCULAR | Status: AC
  Filled 2014-05-12: qty 1

## 2014-05-12 MED ORDER — SIMETHICONE 80 MG PO CHEW
80.0000 mg | CHEWABLE_TABLET | ORAL | Status: DC | PRN
  Administered 2014-05-13: 80 mg via ORAL

## 2014-05-12 MED ORDER — CEFAZOLIN SODIUM-DEXTROSE 2-3 GM-% IV SOLR
2.0000 g | Freq: Once | INTRAVENOUS | Status: DC
  Filled 2014-05-12: qty 50

## 2014-05-12 MED ORDER — PHENYLEPHRINE 8 MG IN D5W 100 ML (0.08MG/ML) PREMIX OPTIME
INJECTION | INTRAVENOUS | Status: AC
  Filled 2014-05-12: qty 100

## 2014-05-12 MED ORDER — NALOXONE HCL 0.4 MG/ML IJ SOLN: 0.4000 mg | INTRAMUSCULAR | Status: DC | PRN

## 2014-05-12 MED ORDER — OXYTOCIN 40 UNITS IN LACTATED RINGERS INFUSION - SIMPLE MED: 62.5000 mL/h | INTRAVENOUS | Status: AC

## 2014-05-12 MED ORDER — LANOLIN HYDROUS EX OINT: 1.0000 "application " | TOPICAL_OINTMENT | CUTANEOUS | Status: DC | PRN

## 2014-05-12 MED ORDER — DIPHENHYDRAMINE HCL 50 MG/ML IJ SOLN: 12.5000 mg | Freq: Four times a day (QID) | INTRAMUSCULAR | Status: DC | PRN

## 2014-05-12 MED ORDER — DIPHENHYDRAMINE HCL 25 MG PO CAPS: 25.0000 mg | ORAL_CAPSULE | ORAL | Status: DC | PRN

## 2014-05-12 MED ORDER — ONDANSETRON HCL 4 MG/2ML IJ SOLN: 4.0000 mg | Freq: Four times a day (QID) | INTRAMUSCULAR | Status: DC | PRN

## 2014-05-12 MED ORDER — DEXTROSE IN LACTATED RINGERS 5 % IV SOLN
INTRAVENOUS | Status: DC
  Administered 2014-05-13: 19:00:00 via INTRAVENOUS
  Administered 2014-05-13: 1 mL via INTRAVENOUS

## 2014-05-12 MED ORDER — HYDROMORPHONE HCL 1 MG/ML IJ SOLN: 0.2500 mg | INTRAMUSCULAR | Status: DC | PRN

## 2014-05-12 MED ORDER — SIMETHICONE 80 MG PO CHEW
80.0000 mg | CHEWABLE_TABLET | ORAL | Status: DC
  Administered 2014-05-13 – 2014-05-14 (×2): 80 mg via ORAL
  Filled 2014-05-12 (×4): qty 1

## 2014-05-12 MED ORDER — MEASLES, MUMPS & RUBELLA VAC ~~LOC~~ INJ: 0.5000 mL | INJECTION | Freq: Once | SUBCUTANEOUS | Status: DC

## 2014-05-12 MED ORDER — ONDANSETRON HCL 4 MG/2ML IJ SOLN: 4.0000 mg | INTRAMUSCULAR | Status: DC | PRN

## 2014-05-12 MED ORDER — PRENATAL MULTIVITAMIN CH
1.0000 | ORAL_TABLET | Freq: Every day | ORAL | Status: DC
  Administered 2014-05-13 – 2014-05-14 (×2): 1 via ORAL
  Filled 2014-05-12 (×2): qty 1

## 2014-05-12 MED ORDER — MEPERIDINE HCL 25 MG/ML IJ SOLN: 6.2500 mg | INTRAMUSCULAR | Status: DC | PRN

## 2014-05-12 MED ORDER — TETANUS-DIPHTH-ACELL PERTUSSIS 5-2.5-18.5 LF-MCG/0.5 IM SUSP: 0.5000 mL | Freq: Once | INTRAMUSCULAR | Status: DC

## 2014-05-12 MED ORDER — MEDROXYPROGESTERONE ACETATE 150 MG/ML IM SUSP: 150.0000 mg | INTRAMUSCULAR | Status: DC | PRN

## 2014-05-12 MED ORDER — HYDROMORPHONE 0.3 MG/ML IV SOLN: INTRAVENOUS | Status: DC

## 2014-05-12 MED ORDER — NALOXONE HCL 1 MG/ML IJ SOLN: 1.0000 ug/kg/h | INTRAVENOUS | Status: DC | PRN

## 2014-05-12 MED ORDER — SODIUM CHLORIDE 0.9 % IJ SOLN: 9.0000 mL | INTRAMUSCULAR | Status: DC | PRN

## 2014-05-12 MED ORDER — SIMETHICONE 80 MG PO CHEW
80.0000 mg | CHEWABLE_TABLET | Freq: Three times a day (TID) | ORAL | Status: DC
  Administered 2014-05-13 – 2014-05-14 (×6): 80 mg via ORAL
  Filled 2014-05-12 (×6): qty 1

## 2014-05-12 MED ORDER — OXYTOCIN 10 UNIT/ML IJ SOLN
40.0000 [IU] | INTRAMUSCULAR | Status: DC | PRN
  Administered 2014-05-12: 40 [IU] via INTRAVENOUS

## 2014-05-12 MED ORDER — PHENYLEPHRINE 8 MG IN D5W 100 ML (0.08MG/ML) PREMIX OPTIME
INJECTION | INTRAVENOUS | Status: DC | PRN
  Administered 2014-05-12: 60 ug/min via INTRAVENOUS

## 2014-05-12 MED ORDER — OXYTOCIN 10 UNIT/ML IJ SOLN
INTRAMUSCULAR | Status: AC
  Filled 2014-05-12: qty 4

## 2014-05-12 MED ORDER — ONDANSETRON HCL 4 MG/2ML IJ SOLN: 4.0000 mg | Freq: Three times a day (TID) | INTRAMUSCULAR | Status: DC | PRN

## 2014-05-12 MED ORDER — ACETAMINOPHEN 325 MG PO TABS
650.0000 mg | ORAL_TABLET | Freq: Four times a day (QID) | ORAL | Status: DC | PRN
  Administered 2014-05-12: 650 mg via ORAL
  Filled 2014-05-12 (×2): qty 2

## 2014-05-12 MED ORDER — DIPHENHYDRAMINE HCL 50 MG/ML IJ SOLN: 12.5000 mg | INTRAMUSCULAR | Status: DC | PRN

## 2014-05-12 MED ORDER — SENNOSIDES-DOCUSATE SODIUM 8.6-50 MG PO TABS
2.0000 | ORAL_TABLET | ORAL | Status: DC
  Administered 2014-05-13 – 2014-05-14 (×3): 2 via ORAL
  Filled 2014-05-12 (×3): qty 2

## 2014-05-12 MED ORDER — IBUPROFEN 600 MG PO TABS
600.0000 mg | ORAL_TABLET | Freq: Four times a day (QID) | ORAL | Status: DC
  Administered 2014-05-13 – 2014-05-15 (×8): 600 mg via ORAL
  Filled 2014-05-12 (×9): qty 1

## 2014-05-12 MED ORDER — WITCH HAZEL-GLYCERIN EX PADS: 1.0000 "application " | MEDICATED_PAD | CUTANEOUS | Status: DC | PRN

## 2014-05-12 SURGICAL SUPPLY — 29 items
CLAMP CORD UMBIL (MISCELLANEOUS) IMPLANT
CLOTH BEACON ORANGE TIMEOUT ST (SAFETY) ×2 IMPLANT
DRAPE SHEET LG 3/4 BI-LAMINATE (DRAPES) IMPLANT
DRSG OPSITE POSTOP 4X10 (GAUZE/BANDAGES/DRESSINGS) ×1 IMPLANT
DRSG OPSITE POSTOP 4X12 (GAUZE/BANDAGES/DRESSINGS) ×1 IMPLANT
DURAPREP 26ML APPLICATOR (WOUND CARE) ×2 IMPLANT
ELECT REM PT RETURN 9FT ADLT (ELECTROSURGICAL) ×2
ELECTRODE REM PT RTRN 9FT ADLT (ELECTROSURGICAL) ×1 IMPLANT
EXTRACTOR VACUUM M CUP 4 TUBE (SUCTIONS) IMPLANT
GLOVE BIO SURGEON STRL SZ 6.5 (GLOVE) ×2 IMPLANT
GLOVE BIOGEL PI IND STRL 7.0 (GLOVE) ×1 IMPLANT
GLOVE BIOGEL PI INDICATOR 7.0 (GLOVE) ×1
GOWN STRL REUS W/TWL LRG LVL3 (GOWN DISPOSABLE) ×4 IMPLANT
KIT ABG SYR 3ML LUER SLIP (SYRINGE) IMPLANT
LIQUID BAND (GAUZE/BANDAGES/DRESSINGS) ×2 IMPLANT
NDL HYPO 25X5/8 SAFETYGLIDE (NEEDLE) IMPLANT
NEEDLE HYPO 25X5/8 SAFETYGLIDE (NEEDLE) IMPLANT
NS IRRIG 1000ML POUR BTL (IV SOLUTION) ×2 IMPLANT
PACK C SECTION WH (CUSTOM PROCEDURE TRAY) ×2 IMPLANT
PAD OB MATERNITY 4.3X12.25 (PERSONAL CARE ITEMS) ×2 IMPLANT
STAPLER VISISTAT 35W (STAPLE) IMPLANT
SUT CHROMIC 0 CT 802H (SUTURE) IMPLANT
SUT CHROMIC 0 CTX 36 (SUTURE) ×6 IMPLANT
SUT MON AB-0 CT1 36 (SUTURE) ×2 IMPLANT
SUT PDS AB 0 CTX 60 (SUTURE) ×2 IMPLANT
SUT PLAIN 0 NONE (SUTURE) IMPLANT
SUT VIC AB 4-0 KS 27 (SUTURE) ×1 IMPLANT
TOWEL OR 17X24 6PK STRL BLUE (TOWEL DISPOSABLE) ×2 IMPLANT
TRAY FOLEY CATH 14FR (SET/KITS/TRAYS/PACK) IMPLANT

## 2014-05-12 NOTE — Progress Notes (Signed)
Pt reports occasional ctx.  No pain.  No vb or lof.  + FM Only up to 2mun of pitocin overnight  FHT reassuring Toco quiet Cvx 1/60/-2, ballottable  A/P:  TOLAC, LGA Increase pitocin Epidural encouraged with pain

## 2014-05-12 NOTE — Lactation Note (Addendum)
This note was copied from the chart of [name]. Lactation Consultation Note  Patient Name: Melanie Colonel BaldMelanie Lorey RUEAV'WToday's Date: 05/12/2014 Reason for consult: Initial assessment Baby 5 hours of life. Baby over 11 pounds and cueing to nurse. Mom is in a lot of pain and nurse calling for alternate pain medications. Assisted mom by hand expressing colostrum to feed by curve-tipped syringe and finger to baby with FOB's assistance. Mom wanting baby to have colostrum but not able to put baby to breast or hand express for herself because she was in pain. Baby tolerated well and contented after receiving colostrum. [facility] stated plan to feed baby now and have nurse assist with latching later when mom feeling up to it. Enc FOB to hold baby STS upright for a little while for colostrum to settle in baby's tummy.   Did not give/review LC brochure due to mom's pain level.  Maternal Data    Feeding Feeding Type: Breast Milk  LATCH Score/Interventions                      Lactation Tools Discussed/Used     Consult Status Consult Status: Follow-up Date: 05/13/14 Follow-up type: In-patient    [name], [name] 05/12/2014, 11:04 PM

## 2014-05-12 NOTE — Op Note (Signed)
Cesarean Section Procedure Note   [name]  05/11/2014 - 05/12/2014  Indications: suspect LGA, previous c-section, unfavorable cervix   Pre-operative Diagnosis: Repeat Cesarean Section for Arrest of Descent/Suspect Large for Gestational Age.   Post-operative Diagnosis: Same   Surgeon: Surgeon(s) and Role:    * Zelphia CairoGretchen Baxter Gonzalez, MD - Primary   Assistants: none  Anesthesia: epidural, spinal   Procedure Details:  The patient was seen in the Holding Room. The risks, benefits, complications, treatment options, and expected outcomes were discussed with the patient. The patient concurred with the proposed plan, giving informed consent. identified as [name] and the procedure verified as C-Section Delivery. A Time Out was held and the above information confirmed.  After induction of anesthesia, the patient was draped and prepped in the usual sterile manner. A transverse was made and carried down through the subcutaneous tissue to the fascia. Fascial incision was made and extended transversely. The fascia was separated from the underlying rectus tissue superiorly and inferiorly. The peritoneum was identified and entered. Peritoneal incision was extended longitudinally. The utero-vesical peritoneal reflection was incised transversely and the bladder flap was bluntly freed from the lower uterine segment. A low transverse uterine incision was made. Delivered from cephalic presentation was a viable female infant. Cord ph was sent for private collection the umbilical cord was clamped and cut cord blood was obtained for evaluation. The placenta was removed Intact and appeared normal. The uterine outline, tubes and ovaries appeared normal}. The uterine incision was closed with running locked sutures of 0chromic gut.   Hemostasis was observed. Lavage was carried out until clear. The fascia was then reapproximated with running sutures of 0PDS. The skin was closed with 4-0Vicryl.   Instrument,  sponge, and needle counts were correct prior the abdominal closure and were correct at the conclusion of the case.    Findings:  Viable infant wt 11#4oz, normal pelvic anatomy   Estimated Blood Loss: 800cc   Urine Output: clear  Specimens:    Complications: no complications  Disposition: PACU - hemodynamically stable.   Maternal Condition: stable   Baby condition / location:  Couplet care / Skin to Skin  Attending Attestation: I was present and scrubbed for the entire procedure.   Signed: Surgeon(s): Zelphia CairoGretchen Jurnie Garritano, MD

## 2014-05-12 NOTE — Anesthesia Preprocedure Evaluation (Signed)
Anesthesia Evaluation  Patient identified by MRN, date of birth, ID band Patient awake    Reviewed: Allergy & Precautions, H&P , Patient's Chart, lab work & pertinent test results  Airway Mallampati: II  TM Distance: >3 FB Neck ROM: full    Dental no notable dental hx.    Pulmonary  breath sounds clear to auscultation  Pulmonary exam normal       Cardiovascular Exercise Tolerance: Good Rhythm:regular Rate:Normal     Neuro/Psych    GI/Hepatic   Endo/Other    Renal/GU      Musculoskeletal   Abdominal   Peds  Hematology   Anesthesia Other Findings   Reproductive/Obstetrics                             Anesthesia Physical Anesthesia Plan  ASA: III  Anesthesia Plan: Spinal   Post-op Pain Management:    Induction:   Airway Management Planned:   Additional Equipment:   Intra-op Plan:   Post-operative Plan:   Informed Consent: I have reviewed the patients History and Physical, chart, labs and discussed the procedure including the risks, benefits and alternatives for the proposed anesthesia with the patient or authorized representative who has indicated his/her understanding and acceptance.   Dental Advisory Given  Plan Discussed with: CRNA  Anesthesia Plan Comments: (Lab work confirmed with CRNA in room. Platelets okay. Discussed spinal anesthetic, and patient consents to the procedure:  included risk of possible headache,backache, failed block, allergic reaction, and nerve injury. This patient was asked if she had any questions or concerns before the procedure started. )        Anesthesia Quick Evaluation  

## 2014-05-12 NOTE — Progress Notes (Signed)
Pt not feeling many contractions.  No vb or lof  FHT cat 1 toco mild Q1 Cvx unchanged, ballottable  A/P:  No change to cervix, LGA, TOLAC I recommended repeat c-section Pt and husband to consider

## 2014-05-12 NOTE — Transfer of Care (Signed)
Immediate Anesthesia Transfer of Care Note  Patient: [name]  Procedure(s) Performed: Procedure(s): CESAREAN SECTION (N/A)  Patient Location: PACU  Anesthesia Type:Spinal and Epidural  Level of Consciousness: awake  Airway & Oxygen Therapy: Patient Spontanous Breathing  Post-op Assessment: Report given to PACU RN and Post -op Vital signs reviewed and stable  Post vital signs: stable  Complications: No apparent anesthesia complications

## 2014-05-12 NOTE — Progress Notes (Signed)
Pt and husband agree to proceed with repeat c-section Risks, benefits and alternatives reviewed, questions answered, informed consent obtained. Will order prophylactic abx OR called

## 2014-05-12 NOTE — Anesthesia Procedure Notes (Addendum)
Spinal Patient location during procedure: OR Preanesthetic Checklist Completed: patient identified, site marked, surgical consent, pre-op evaluation, timeout performed, IV checked, risks and benefits discussed and monitors and equipment checked Spinal Block Patient position: sitting Prep: DuraPrep Patient monitoring: heart rate, cardiac monitor, continuous pulse ox and blood pressure Approach: midline Location: L3-4 Injection technique: single-shot Needle Needle type: Sprotte  Needle gauge: 24 G Needle length: 9 cm Additional Notes Spinal Dosage in OR  Bupivicaine ml       1.9   Epidural Patient location during procedure: OB  Preanesthetic Checklist Completed: patient identified, site marked, surgical consent, pre-op evaluation, timeout performed, IV checked, risks and benefits discussed and monitors and equipment checked  Epidural Patient position: sitting Prep: site prepped and draped and DuraPrep Patient monitoring: continuous pulse ox and blood pressure Approach: midline Location: L3-L4 Injection technique: LOR air  Needle:  Needle type: Tuohy  Needle gauge: 17 G Needle length: 9 cm and 9 Needle insertion depth: 9 cm Catheter type: closed end flexible Catheter size: 19 Gauge Catheter at skin depth: 18 cm Test dose: negative  Assessment Sensory level: T3 Events: blood not aspirated, injection not painful, no injection resistance, negative IV test and no paresthesia  Additional Notes Spinal Dosage in OR Sprotte through touhy;   Bupivicaine ml       1.9  Epidural catheter (-) asp CSF, tolerated well

## 2014-05-13 LAB — CBC
HCT: 26.4 % — ABNORMAL LOW (ref 36.0–46.0)
Hemoglobin: 8.8 g/dL — ABNORMAL LOW (ref 12.0–15.0)
MCH: 28.9 pg (ref 26.0–34.0)
MCHC: 33.3 g/dL (ref 30.0–36.0)
MCV: 86.6 fL (ref 78.0–100.0)
Platelets: 131 10*3/uL — ABNORMAL LOW (ref 150–400)
RBC: 3.05 MIL/uL — ABNORMAL LOW (ref 3.87–5.11)
RDW: 14.1 % (ref 11.5–15.5)
WBC: 11 10*3/uL — ABNORMAL HIGH (ref 4.0–10.5)

## 2014-05-13 MED ORDER — FENTANYL CITRATE 0.05 MG/ML IJ SOLN
50.0000 ug | INTRAMUSCULAR | Status: DC | PRN
  Administered 2014-05-13 (×2): 50 ug via INTRAVENOUS
  Filled 2014-05-13 (×2): qty 2

## 2014-05-13 NOTE — Anesthesia Postprocedure Evaluation (Signed)
  Anesthesia Post-op Note  Anesthesia Post Note  Patient: Melanie LainMelanie J Gambrell  Procedure(s) Performed: Procedure(s) (LRB): CESAREAN SECTION (N/A)  Anesthesia type: Spinal  Patient location: Mother/Baby  Post pain: Pain level controlled  Post assessment: Post-op Vital signs reviewed  Last Vitals:  Filed Vitals:   05/13/14 0340  BP: 125/83  Pulse: 63  Temp:   Resp:     Post vital signs: Reviewed  Level of consciousness: awake  Complications: No apparent anesthesia complications

## 2014-05-13 NOTE — Progress Notes (Signed)
RN attempted to ambulate patient to bathroom.  Patient was able to tolerate standing at bedside. Patient refused to walk to bathroom because of pain and discomfort.  Patient is able to bear weight and expresses no dizziness with standing. RN medicated patient with ordered pain medications. Foley catheter remains intact due to non ambulatory status at this time. Boykin PeekNancy Riddik Senna, RN

## 2014-05-13 NOTE — Lactation Note (Signed)
This note was copied from the chart of [name]. Lactation Consultation Note Follow up at 23 hours of age.  Baby has been syringe fed EBM at finger feedings due to poor latching.  Mom has flat nipples and when baby latches he doesn't keep sucking.  MOM and FOB are requesting a nipple shield.  Baby is not latching and appears sleepy.  Attempted waking baby and hand expressing colostrum to baby's mouth.  #20 Nipple shield given with instructions and encouraged post pumping and op follow up appt.  Mom is apply to apply nipple shield, but due to baby not eating at this time asked mom to request assistance to determine if she may need a #24.  Baby not latching and RN at bedside to ambulate mom.  Encouraged mom to attempt with feeding cues.  Baby is 11#3oz and somewhat difficult to latch in football hold, but mom declines cross cradle due to abdominal pain.    Patient Name: [name] [name] ZOXWR'UToday's Date: 05/13/2014 Reason for consult: Follow-up assessment;Difficult latch   Maternal Data Has patient been taught Hand Expression?: Yes  Feeding Feeding Type: Breast Fed  LATCH Score/Interventions Latch: Repeated attempts needed to sustain latch, nipple held in mouth throughout feeding, stimulation needed to elicit sucking reflex. Intervention(s): Adjust position;Assist with latch;Breast massage;Breast compression  Audible Swallowing: None Intervention(s): Skin to skin  Type of Nipple: Flat Intervention(s): Double electric pump  Comfort (Breast/Nipple): Soft / non-tender     Hold (Positioning): Assistance needed to correctly position infant at breast and maintain latch. Intervention(s): Skin to skin;Position options;Support Pillows;Breastfeeding basics reviewed  LATCH Score: 5  Lactation Tools Discussed/Used Initiated by:: RN   Consult Status Consult Status: Follow-up Date: 05/14/14 Follow-up type: In-patient    Lorena Clearman, Arvella MerlesJana Lynn 05/13/2014, 5:25 PM

## 2014-05-13 NOTE — Progress Notes (Signed)
Subjective: Postpartum Day 1: Cesarean Delivery Patient reports incisional pain and tolerating PO.    Objective: Vital signs in last 24 hours: Temp:  [97.4 F (36.3 C)-98.4 F (36.9 C)] 98.4 F (36.9 C) (01/06 0010) Pulse Rate:  [61-106] 63 (01/06 0340) Resp:  [15-22] 20 (01/06 0010) BP: (104-144)/(59-96) 125/83 mmHg (01/06 0340) SpO2:  [96 %-100 %] 97 % (01/06 0340)  Physical Exam:  General: alert and cooperative Lochia: appropriate Uterine Fundus: firm Incision: healing well DVT Evaluation: No evidence of DVT seen on physical exam. Negative Homan's sign. No cords or calf tenderness. Calf/Ankle edema is present.   Recent Labs  05/11/14 2320 05/13/14 0555  HGB 11.6* 8.8*  HCT 34.2* 26.4*    Assessment/Plan: Status post Cesarean section. Doing well postoperatively.  Continue current care Desires baby circ.  [name] G 05/13/2014, 7:57 AM

## 2014-05-14 ENCOUNTER — Encounter (HOSPITAL_COMMUNITY): Payer: Self-pay | Admitting: Obstetrics and Gynecology

## 2014-05-14 NOTE — Progress Notes (Signed)
[name]'s breast are compressible with lots of colostrum.  She reports they are not tender.  Baby latched on both sides with sustained latch for 15 on each side.  She does not need to use the nipple shield at this time.  I will initiate shells to help keep fluid away from nipples.

## 2014-05-14 NOTE — Progress Notes (Signed)
Subjective: Postpartum Day 2: Cesarean Delivery Patient reports tolerating PO, + flatus and no problems voiding.    Objective: Vital signs in last 24 hours: Temp:  [97.5 F (36.4 C)-97.8 F (36.6 C)] 97.6 F (36.4 C) (01/07 0615) Pulse Rate:  [73-89] 73 (01/07 0615) Resp:  [18-20] 18 (01/07 0615) BP: (125-129)/(70-85) 125/70 mmHg (01/07 0615) SpO2:  [97 %-98 %] 97 % (01/06 1700)  Physical Exam:  General: alert and cooperative Lochia: appropriate Uterine Fundus: firm Incision: healing well DVT Evaluation: No evidence of DVT seen on physical exam. Negative Homan's sign. No cords or calf tenderness. Calf/Ankle edema is present.   Recent Labs  05/11/14 2320 05/13/14 0555  HGB 11.6* 8.8*  HCT 34.2* 26.4*    Assessment/Plan: Status post Cesarean section. Doing well postoperatively.  Continue current care.  CURTIS,CAROL G 05/14/2014, 8:59 AM

## 2014-05-15 MED ORDER — IBUPROFEN 600 MG PO TABS: 600.0000 mg | ORAL_TABLET | Freq: Four times a day (QID) | ORAL | Status: DC

## 2014-05-15 MED ORDER — OXYCODONE-ACETAMINOPHEN 5-325 MG PO TABS: 1.0000 | ORAL_TABLET | ORAL | Status: DC | PRN

## 2014-05-15 NOTE — Lactation Note (Signed)
This note was copied from the chart of Boy Talyia Omdahl. Lactation Consultation Note  P2, Mother has history of mastitis.  Baby 7.5% weight loss. Reviewed waking techniques. Undressed baby to feed. Assisted mother with achieving a deeper latch in football hold, waiting until he open wide and getting most of the areola in his mouth. Reviewed breast massage and how to keep him active at the breast. Sucks and some swallows observed. Encouraged parents to monitor voids/stools and attend support group. Mother's nipples tender pink.  Provided comfort gels and recommend applying ebm. Discussed engorgement care and mastitis prevention.     Patient Name: [name] Melysa Delahoussaye ZOXWR'UToday's Date: 05/15/2014 Reason for consult: Follow-up assessment   Maternal Data    Feeding Feeding Type: Breast Fed Length of feed: 10 min  LATCH Score/Interventions Latch: Grasps breast easily, tongue down, lips flanged, rhythmical sucking. Intervention(s): Breast massage;Assist with latch;Adjust position;Breast compression  Audible Swallowing: A few with stimulation  Type of Nipple: Everted at rest and after stimulation Intervention(s): Hand pump  Comfort (Breast/Nipple): Filling, red/small blisters or bruises, mild/mod discomfort  Problem noted: Mild/Moderate discomfort Interventions (Mild/moderate discomfort): Hand expression;Pre-pump if needed;Comfort gels;Post-pump  Hold (Positioning): Assistance needed to correctly position infant at breast and maintain latch.  LATCH Score: 7  Lactation Tools Discussed/Used     Consult Status Consult Status: Complete    Hardie PulleyBerkelhammer, Giara Mcgaughey Boschen 05/15/2014, 9:48 AM

## 2014-05-15 NOTE — Discharge Summary (Signed)
Obstetric Discharge Summary Reason for Admission: induction of labor Prenatal Procedures: ultrasound Intrapartum Procedures: cesarean: low cervical, transverse Postpartum Procedures: none Complications-Operative and Postpartum: none HEMOGLOBIN  Date Value Ref Range Status  05/13/2014 8.8* 12.0 - 15.0 g/dL Final    Comment:    DELTA CHECK NOTED REPEATED TO VERIFY    HCT  Date Value Ref Range Status  05/13/2014 26.4* 36.0 - 46.0 % Final    Physical Exam:  General: alert and cooperative Lochia: appropriate Uterine Fundus: firm Incision: healing well DVT Evaluation: No evidence of DVT seen on physical exam. Negative Homan's sign. No cords or calf tenderness. Calf/Ankle edema is present.  Discharge Diagnoses: Term Pregnancy-delivered  Discharge Information: Date: 05/15/2014 Activity: pelvic rest Diet: routine Medications: PNV, Ibuprofen and Percocet Condition: stable Instructions: refer to practice specific booklet Discharge to: home   Newborn Data: Live born female  Birth Weight: 11 lb 4.2 oz (5109 g) APGAR: 8, 9  Home with mother.  Mumtaz Lovins G 05/15/2014, 8:46 AM

## 2014-05-16 ENCOUNTER — Inpatient Hospital Stay (HOSPITAL_COMMUNITY)
Admission: AD | Admit: 2014-05-16 | Discharge: 2014-05-17 | Disposition: A | Discharge status: Home / Self Care | Payer: BLUE CROSS/BLUE SHIELD | Source: Ambulatory Visit | Attending: Obstetrics and Gynecology | Admitting: Obstetrics and Gynecology

## 2014-05-16 DIAGNOSIS — O864 Pyrexia of unknown origin following delivery: Secondary | ICD-10-CM | POA: Diagnosis present

## 2014-05-16 DIAGNOSIS — O9123 Nonpurulent mastitis associated with lactation: Secondary | ICD-10-CM

## 2014-05-16 DIAGNOSIS — O9122 Nonpurulent mastitis associated with the puerperium: Secondary | ICD-10-CM | POA: Diagnosis not present

## 2014-05-16 NOTE — MAU Note (Addendum)
Had C/S 1/5 after being induced 4days postdates and FTP. Baby weighed 11lb 4oz. Is breastfeeding and L nipple cracked and bleeding. Denies redness of breasts. Does feel like milk is coming in. Has had mastitis before. Only nipples hurt now. R side of incision more painful than overall incision but not terrible. TEmp was 100.5 tonight and took Ibuprofen 800 po at 2200

## 2014-05-17 ENCOUNTER — Encounter (HOSPITAL_COMMUNITY): Payer: Self-pay | Admitting: *Deleted

## 2014-05-17 DIAGNOSIS — O9123 Nonpurulent mastitis associated with lactation: Secondary | ICD-10-CM

## 2014-05-17 LAB — URINE MICROSCOPIC-ADD ON

## 2014-05-17 LAB — URINALYSIS, ROUTINE W REFLEX MICROSCOPIC
Bilirubin Urine: NEGATIVE
Glucose, UA: NEGATIVE mg/dL
Ketones, ur: NEGATIVE mg/dL
Nitrite: NEGATIVE
Protein, ur: NEGATIVE mg/dL
Specific Gravity, Urine: 1.01 (ref 1.005–1.030)
Urobilinogen, UA: 0.2 mg/dL (ref 0.0–1.0)
pH: 7.5 (ref 5.0–8.0)

## 2014-05-17 MED ORDER — AMOXICILLIN-POT CLAVULANATE 875-125 MG PO TABS: 1.0000 | ORAL_TABLET | Freq: Two times a day (BID) | ORAL | Status: DC

## 2014-05-17 NOTE — Discharge Instructions (Signed)
Breastfeeding and Mastitis °Mastitis is inflammation of the breast tissue. It can occur in women who are breastfeeding. This can make breastfeeding painful. Mastitis will sometimes go away on its own. Your health care provider will help determine if treatment is needed. °CAUSES °Mastitis is often associated with a blocked milk (lactiferous) duct. This can happen when too much milk builds up in the breast. Causes of excess milk in the breast can include: °· Poor latch-on. If your baby is not latched onto the breast properly, she or he may not empty your breast completely while breastfeeding. °· Allowing too much time to pass between feedings. °· Wearing a bra or other clothing that is too tight. This puts extra pressure on the lactiferous ducts so milk does not flow through them as it should. °Mastitis can also be caused by a bacterial infection. Bacteria may enter the breast tissue through cuts or openings in the skin. In women who are breastfeeding, this may occur because of cracked or irritated skin. Cracks in the skin are often caused when your baby does not latch on properly to the breast. °SIGNS AND SYMPTOMS °· Swelling, redness, tenderness, and pain in an area of the breast. °· Swelling of the glands under the arm on the same side. °· Fever may or may not accompany mastitis. °If an infection is allowed to progress, a collection of pus (abscess) may develop. °DIAGNOSIS  °Your health care provider can usually diagnose mastitis based on your symptoms and a physical exam. Tests may be done to help confirm the diagnosis. These may include: °· Removal of pus from the breast by applying pressure to the area. This pus can be examined in the lab to determine which bacteria are present. If an abscess has developed, the fluid in the abscess can be removed with a needle. This can also be used to confirm the diagnosis and determine the bacteria present. In most cases, pus will not be present. °· Blood tests to determine if  your body is fighting a bacterial infection. °· Mammogram or ultrasound tests to rule out other problems or diseases. °TREATMENT  °Mastitis that occurs with breastfeeding will sometimes go away on its own. Your health care provider may choose to wait 24 hours after first seeing you to decide whether a prescription medicine is needed. If your symptoms are worse after 24 hours, your health care provider will likely prescribe an antibiotic medicine to treat the mastitis. He or she will determine which bacteria are most likely causing the infection and will then select an appropriate antibiotic medicine. This is sometimes changed based on the results of tests performed to identify the bacteria, or if there is no response to the antibiotic medicine selected. Antibiotic medicines are usually given by mouth. You may also be given medicine for pain. °HOME CARE INSTRUCTIONS °· Only take over-the-counter or prescription medicines for pain, fever, or discomfort as directed by your health care provider. °· If your health care provider prescribed an antibiotic medicine, take the medicine as directed. Make sure you finish it even if you start to feel better. °· Do not wear a tight or underwire bra. Wear a soft, supportive bra. °· Increase your fluid intake, especially if you have a fever. °· Continue to empty the breast. Your health care provider can tell you whether this milk is safe for your infant or needs to be thrown out. You may be told to stop nursing until your health care provider thinks it is safe for your baby.   Use a breast pump if you are advised to stop nursing.  Keep your nipples clean and dry.  Empty the first breast completely before going to the other breast. If your baby is not emptying your breasts completely for some reason, use a breast pump to empty your breasts.  If you go back to work, pump your breasts while at work to stay in time with your nursing schedule.  Avoid allowing your breasts to become  overly filled with milk (engorged). SEEK MEDICAL CARE IF:  You have pus-like discharge from the breast.  Your symptoms do not improve with the treatment prescribed by your health care provider within 2 days. SEEK IMMEDIATE MEDICAL CARE IF:  Your pain and swelling are getting worse.  You have pain that is not controlled with medicine.  You have a red line extending from the breast toward your armpit.  You have a fever or persistent symptoms for more than 2-3 days.  You have a fever and your symptoms suddenly get worse. MAKE SURE YOU:   Understand these instructions.  Will watch your condition.  Will get help right away if you are not doing well or get worse. Document Released: 08/19/2004 Document Revised: 04/29/2013 Document Reviewed: 11/28/2012 [facility] Patient Information 2015 [place], [place]. This information is not intended to replace advice given to you by your health care provider. Make sure you discuss any questions you have with your health care provider.  Breastfeeding Challenges and Solutions Even though breastfeeding is natural, it can be challenging, especially in the first few weeks after childbirth. It is normal for problems to arise when starting to breastfeed your new baby, even if you have breastfed before. This document provides some solutions to the most common breastfeeding challenges.  CHALLENGES AND SOLUTIONS Challenge--Cracked or Sore Nipples Cracked or sore nipples are commonly experienced by breastfeeding mothers. Cracked or sore nipples often are caused by inadequate latching (when your baby's mouth attaches to your breast to breastfeed). Soreness can also happen if your baby is not positioned properly at your breast. Although nipple cracking and soreness are common during the first week after birth, nipple pain is never normal. If you experience nipple cracking or soreness that lasts longer than 1 week or nipple pain, call your health care provider or  lactation consultant.  Solution Ensure proper latching and positioning of your baby by following the steps below:  Find a comfortable place to sit or lie down, with your neck and back well supported.  Place a pillow or rolled up blanket under your baby to bring him or her to the level of your breast (if you are seated).  Make sure that your baby's abdomen is facing your abdomen.  Gently massage your breast. With your fingertips, massage from your chest wall toward your nipple in a circular motion. This encourages milk flow. You may need to continue this action during the feeding if your milk flows slowly.  Support your breast with 4 fingers underneath and your thumb above your nipple. Make sure your fingers are well away from your nipple and your baby's mouth.  Stroke your baby's lips gently with your finger or nipple.  When your baby's mouth is open wide enough, quickly bring your baby to your breast, placing your entire nipple and as much of the colored area around your nipple (areola) as possible into your baby's mouth.  More areola should be visible above your baby's upper lip than below the lower lip.  Your baby's tongue should be  between his or her lower gum and your breast.  Ensure that your baby's mouth is correctly positioned around your nipple (latched). Your baby's lips should create a seal on your breast and be turned out (everted).  It is common for your baby to suck for about 2-3 minutes in order to start the flow of breast milk. Signs that your baby has successfully latched on to your nipple include:   Quietly tugging or quietly sucking without causing you pain.   Swallowing heard between every 3-4 sucks.   Muscle movement above and in front of his or her ears with sucking.  Signs that your baby has not successfully latched on to nipple include:   Sucking sounds or smacking sounds from your baby while nursing.   Nipple pain.  Ensure that your breasts stay  moisturized and healthy by:  Avoiding the use of soap on your nipples.   Wearing a supportive bra. Avoid wearing underwire-style bras or tight bras.   Air drying your nipples for 3-4 minutes after each feeding.   Using only cotton bra pads to absorb breast milk leakage. Leaking of breast milk between feedings is normal. Be sure to change the pads if they become soaked with milk.  Using lanolin on your nipples after nursing. Lanolin helps to maintain your skin's normal moisture barrier. If you use pure lanolin you do not need to wash it off before feeding your baby again. Pure lanolin is not toxic to your baby. You may also hand express a few drops of breast milk and gently massage that milk into your nipples, allowing it to air dry. Challenge--Breast Engorgement Breast engorgement is the overfilling of your breasts with breast milk. In the first few weeks after giving birth, you may experience breast engorgement. Breast engorgement can make your breasts throb and feel hard, tightly stretched, warm, and tender. Engorgement peaks about the fifth day after you give birth. Having breast engorgement does not mean you have to stop breastfeeding your baby. Solution  Breastfeed when you feel the need to reduce the fullness of your breasts or when your baby shows signs of hunger. This is called "breastfeeding on demand."  Newborns (babies younger than 4 weeks) often breastfeed every 1-3 hours during the day. You may need to awaken your baby to feed if he or she is asleep at a feeding time.  Do not allow your baby to sleep longer than 5 hours during the night without a feeding.  Pump or hand express breast milk before breastfeeding to soften your breast, areola, and nipple.  Apply warm, moist heat (in the shower or with warm water-soaked hand towels) just before feeding or pumping, or massage your breast before or during breastfeeding. This increases circulation and helps your milk to  flow.  Completely empty your breasts when breastfeeding or pumping. Afterward, wear a snug bra (nursing or regular) or tank top for 1-2 days to signal your body to slightly decrease milk production. Only wear snug bras or tank tops to treat engorgement. Tight bras typically should be avoided by breastfeeding mothers. Once engorgement is relieved, return to wearing regular, loose-fitting clothes.  Apply ice packs to your breasts to lessen the pain from engorgement and relieve swelling, unless the ice is uncomfortable for you.  Do not delay feedings. Try to relax when it is time to feed your baby. This helps to trigger your "let-down reflex," which releases milk from your breast.  Ensure your baby is latched on to your breast and  positioned properly while breastfeeding.  Allow your baby to remain at your breast as long as he or she is latched on well and actively sucking. Your baby will let you know when he or she is done breastfeeding by pulling away from your breast or falling asleep.  Avoid introducing bottles or pacifiers to your baby in the early weeks of breastfeeding. Wait to introduce these things until after resolving any breastfeeding challenges.  Try to pump your milk on the same schedule as when your baby would breastfeed if you are returning to work or away from home for an extended period.  Drink plenty of fluids to avoid dehydration, which can eventually put you at greater risk of breast engorgement. If you follow these suggestions, your engorgement should improve in 24-48 hours. If you are still experiencing difficulty, call your lactation consultant or health care provider.  Challenge--Plugged Milk Ducts Plugged milk ducts occur when the duct does not drain milk effectively and becomes swollen. Wearing a tight-fitting nursing bra or having difficulty with latching may cause plugged milk ducts. Not drinking enough water (8-10 c [1.9-2.4 L] per day) can contribute to plugged milk  ducts. Once a duct has become plugged, hard lumps, soreness, and redness may develop in your breast.  Solution Do not delay feedings. Feed your baby frequently and try to empty your breasts of milk at each feeding. Try breastfeeding from the affected side first so there is a better chance that the milk will drain completely from that breast. Apply warm, moist towels to your breasts for 5-10 minutes before feeding. Alternatively, a hot shower right before breastfeeding can provide the moist heat that can encourage milk flow. Gentle massage of the sore area before and during a feeding may also help. Avoid wearing tight clothing or bras that put pressure on your breasts. Wear bras that offer good support to your breasts, but avoid underwire bras. If you have a plugged milk duct and develop a fever, you need to see your health care provider.  Challenge--Mastitis Mastitis is inflammation of your breast. It usually is caused by a bacterial infection and can cause flu-like symptoms. You may develop redness in your breast and a fever. Often when mastitis occurs, your breast becomes firm, warm, and very painful. The most common causes of mastitis are poor latching, ineffective sucking from your baby, consistent pressure on your breast (possibly from wearing a tight-fitting bra or shirt that restricts the milk flow), unusual stress or fatigue, or missed feedings.  Solution You will be given antibiotic medicine to treat the infection. It is still important to breastfeed frequently to empty your breasts. Continuing to breastfeed while you recover from mastitis will not harm your baby. Make sure your baby is positioned properly during every feeding. Apply moist heat to your breasts for a few minutes before feeding to help the milk flow and to help your breasts empty more easily. Challenge--Thrush Ginette Pitman is a yeast infection that can form on your nipples, in your breast, or in your baby's mouth. It causes itching,  soreness, burning or stabbing pain, and sometimes a rash.  Solution You will be given a medicated ointment for your nipples, and your baby will be given a liquid medicine for his or her mouth. It is important that you and your baby are treated at the same time because thrush can be passed between you and your baby. Change disposable nursing pads often. Any bras, towels, or clothing that come in contact with infected areas of  your body or your baby's body need to be washed in very hot water every day. Wash your hands and your baby's hands often. All pacifiers, bottle nipples, or toys your baby puts in his or her mouth should be boiled once a day for 20 minutes. After 1 week of treatment, discard pacifiers and bottle nipples and buy new ones. All breast pump parts that touch the milk need to be boiled for 20 minutes every day. Challenge--Low Milk Supply You may not be producing enough milk if your baby is not gaining the proper amount of weight. Breast milk production is based on a supply-and-demand system. Your milk supply depends on how frequently and effectively your baby empties your breast. Solution The more you breastfeed and pump, the more breast milk you will produce. It is important that your baby empties at least one of your breasts at each feeding. If this is not happening, then use a breast pump or hand express any milk that remains. This will help to drain as much milk as possible at each feeding. It will also signal your body to produce more milk. If your baby is not emptying your breasts, it may be due to latching, sucking, or positioning problems. If low milk supply continues after addressing these issues, contact your health care provider or a lactation specialist as soon as possible. Challenge--Inverted or Flat Nipples Some women have nipples that turn inward instead of protruding outward. Other women have nipples that are flat. Inverted or flat nipples can sometimes make it more difficult  for your baby to latch onto your breast. Solution You may be given a small device that pulls out inverted nipples. This device should be applied right before your baby is brought to your breast. You can also try using a breast pump for a short time before placing the baby at your breast. The pump can pull your nipple outwards to help your infant latch more easily. The baby's sucking motion will help the inverted nipple protrude as well.  If you have flat nipples, encourage your baby to latch onto your breast and feed frequently in the early days after birth. This will give your baby practice latching on correctly while your breast is still soft. When your milk supply increases, between the second and fifth day after birth and your breasts become full, your baby will have an easier time latching.  Contact a lactation consultant if you still have concerns. She or he can teach you additional techniques to address breastfeeding problems related to nipple shape and position.  FOR MORE INFORMATION [employer]: www.llli.org Document Released: 10/16/2005 Document Revised: 04/29/2013 Document Reviewed: 10/18/2012 [facility] Patient Information 2015 [place], [place]. This information is not intended to replace advice given to you by your health care provider. Make sure you discuss any questions you have with your health care provider.

## 2014-05-17 NOTE — MAU Provider Note (Signed)
Chief Complaint: Fever   First Provider Initiated Contact with Patient 05/17/14 0143     SUBJECTIVE HPI: [name] is a 34 y.o. G3P2011 Day 5 Postop following repeat C/S who presents to maternity admissions reporting fever/chills.  At home her temperature was 100.5 F at 8 pm.  She reports light lochia, denies vaginal itching/burning, urinary symptoms, h/a, dizziness, or n/v.  She denies visual disturbances or epigastric pain.     Past Medical History  Diagnosis Date  . Allergic rhinitis   . Cough   . Asthma    Past Surgical History  Procedure Laterality Date  . Cesarean section classical  2012  . Cholecystectomy  2010  . Wisdom tooth extraction  1997  . Dilation and evacuation N/A 05/16/2013    Procedure: DILATATION AND EVACUATION;  Surgeon: Zelphia CairoGretchen Adkins, MD;  Location: [facility];  Service: Gynecology;  Laterality: N/A;  . Cesarean section N/A 05/12/2014    Procedure: CESAREAN SECTION;  Surgeon: Zelphia CairoGretchen Adkins, MD;  Location: [facility];  Service: Obstetrics;  Laterality: N/A;   History   Social History  . Marital Status: Married    Spouse Name: N/A    Number of Children: N/A  . Years of Education: N/A   Occupational History  . Not on file.   Social History Main Topics  . Smoking status: Never Smoker   . Smokeless tobacco: Never Used  . Alcohol Use: No  . Drug Use: No  . Sexual Activity: Not Currently   Other Topics Concern  . Not on file   Social History Narrative   No current facility-administered medications on file prior to encounter.   Current Outpatient Prescriptions on File Prior to Encounter  Medication Sig Dispense Refill  . ibuprofen (ADVIL,MOTRIN) 600 MG tablet Take 1 tablet (600 mg total) by mouth every 6 (six) hours. 30 tablet 1  . oxyCODONE-acetaminophen (PERCOCET/ROXICET) 5-325 MG per tablet Take 1 tablet by mouth every 4 (four) hours as needed (for pain scale less than 7). 30 tablet 0  . Prenatal Vit-Fe Fumarate-FA (PRENATAL MULTIVITAMIN) TABS  tablet Take 1 tablet by mouth daily at 12 noon.    Marland Kitchen. albuterol (PROVENTIL HFA;VENTOLIN HFA) 108 (90 BASE) MCG/ACT inhaler Inhale 2 puffs into the lungs every 6 (six) hours as needed for wheezing or shortness of breath (rescue).     Allergies  Allergen Reactions  . Morphine And Related Shortness Of Breath    ROS: Pertinent items in HPI  OBJECTIVE Blood pressure 149/89, pulse 85, temperature 98.3 F (36.8 C), resp. rate 20, height 6' (1.829 m), weight 125.102 kg (275 lb 12.8 oz), SpO2 100 %, currently breastfeeding. GENERAL: Well-developed, well-nourished female in no acute distress.  HEENT: Normocephalic HEART: normal rate, heart sounds, regular rhythm RESP: normal effort, lung sounds clear and equal bilaterally BREAST: Left nipple with no erythema, small area of broken skin with scant bleeding ABDOMEN: Soft, non-tender, honeycomb dressing removed from incision, incision well approximated without erythema, edema, or exudate EXTREMITIES: Nontender, no edema NEURO: Alert and oriented   LAB RESULTS Results for orders placed or performed during the hospital encounter of 05/16/14 (from the past 24 hour(s))  Urinalysis, Routine w reflex microscopic     Status: Abnormal   Collection Time: 05/16/14 10:30 PM  Result Value Ref Range   Color, Urine YELLOW YELLOW   APPearance CLEAR CLEAR   Specific Gravity, Urine 1.010 1.005 - 1.030   pH 7.5 5.0 - 8.0   Glucose, UA NEGATIVE NEGATIVE mg/dL   Hgb urine  dipstick LARGE (A) NEGATIVE   Bilirubin Urine NEGATIVE NEGATIVE   Ketones, ur NEGATIVE NEGATIVE mg/dL   Protein, ur NEGATIVE NEGATIVE mg/dL   Urobilinogen, UA 0.2 0.0 - 1.0 mg/dL   Nitrite NEGATIVE NEGATIVE   Leukocytes, UA SMALL (A) NEGATIVE  Urine microscopic-add on     Status: Abnormal   Collection Time: 05/16/14 10:30 PM  Result Value Ref Range   Squamous Epithelial / LPF RARE RARE   WBC, UA 3-6 <3 WBC/hpf   RBC / HPF 21-50 <3 RBC/hpf   Bacteria, UA FEW (A) RARE    ASSESSMENT 1.  Nonpurulent mastitis associated with lactation     PLAN Consult Dr Arelia Sneddon Discharge home Augmentin 500 mg BID x 7 days Continue to breastfeed/pump both breasts frequently      Follow-up Information    Follow up with Juluis Mire, MD.   Specialty:  Obstetrics and Gynecology   Why:  As scheduled   Contact information:   [address], [address] [place], [address] [place] [address] [phone]       Follow up with [facility] [place] [facility].   Specialty:  Lactation   Why:  As needed   Contact information:   [address] [id number] mc [place] [address] [phone]      Follow up with [facility] [place] [facility].   Why:  As needed for emergencies   Contact information:   [address] [id number] mc [place] [address] [phone]      Sharen Counter Certified Nurse-Midwife 05/17/2014  2:49 AM

## 2014-05-17 NOTE — MAU Note (Signed)
Honeycomb dsg removed by Sharen CounterLisa Leftwich -Kirby CNM. Incision clean and dry.

## 2014-05-17 NOTE — Progress Notes (Signed)
Written and verbal d/c instructions given and understanding voiced. 

## 2014-05-17 NOTE — MAU Note (Signed)
Honeycomb dsg on. Clean and dry.

## 2014-05-19 ENCOUNTER — Encounter (HOSPITAL_COMMUNITY): Payer: Self-pay | Admitting: *Deleted

## 2014-05-19 ENCOUNTER — Inpatient Hospital Stay (HOSPITAL_COMMUNITY)
Admission: AD | Admit: 2014-05-19 | Discharge: 2014-05-19 | Disposition: A | Discharge status: Home / Self Care | Payer: BLUE CROSS/BLUE SHIELD | Source: Ambulatory Visit | Attending: Obstetrics and Gynecology | Admitting: Obstetrics and Gynecology

## 2014-05-19 DIAGNOSIS — N390 Urinary tract infection, site not specified: Secondary | ICD-10-CM

## 2014-05-19 DIAGNOSIS — O9089 Other complications of the puerperium, not elsewhere classified: Secondary | ICD-10-CM | POA: Insufficient documentation

## 2014-05-19 DIAGNOSIS — O8620 Urinary tract infection following delivery, unspecified: Secondary | ICD-10-CM | POA: Insufficient documentation

## 2014-05-19 DIAGNOSIS — G8918 Other acute postprocedural pain: Secondary | ICD-10-CM | POA: Insufficient documentation

## 2014-05-19 DIAGNOSIS — O864 Pyrexia of unknown origin following delivery: Secondary | ICD-10-CM | POA: Diagnosis present

## 2014-05-19 LAB — URINALYSIS, ROUTINE W REFLEX MICROSCOPIC
Bilirubin Urine: NEGATIVE
Glucose, UA: NEGATIVE mg/dL
Ketones, ur: NEGATIVE mg/dL
Nitrite: NEGATIVE
Protein, ur: 30 mg/dL — AB
Specific Gravity, Urine: 1.015 (ref 1.005–1.030)
Urobilinogen, UA: 0.2 mg/dL (ref 0.0–1.0)
pH: 6.5 (ref 5.0–8.0)

## 2014-05-19 LAB — URINE MICROSCOPIC-ADD ON

## 2014-05-19 MED ORDER — NITROFURANTOIN MONOHYD MACRO 100 MG PO CAPS: 100.0000 mg | ORAL_CAPSULE | Freq: Two times a day (BID) | ORAL | Status: DC

## 2014-05-19 NOTE — MAU Provider Note (Signed)
Chief Complaint: Fever   First Provider Initiated Contact with Patient 05/19/14 1647     SUBJECTIVE HPI: [name] is a [age] y.o. G3P2011 on Day 7 following repeat C/S who presents to maternity admissions reporting fever and chills this morning with temp at home of 104 F.  She also reports some suprapubic pain with urination x 2-3 days.  She was seen in MAU on 05/16/14 for similar symptoms and had cracked nipples with poor breastfeeding latch at that time.  Latch has improved and her breasts have minimal pain today.  She was started on Augmentin for fever of unknown source, likely Mastitis, at her visit on 05/17/14.  She reports normal light lochia, denies vaginal itching/burning, h/a, dizziness, or n/v.     Past Medical History  Diagnosis Date  . Allergic rhinitis   . Cough   . Asthma    Past Surgical History  Procedure Laterality Date  . Cesarean section classical  2012  . Cholecystectomy  2010  . Wisdom tooth extraction  1997  . Dilation and evacuation N/A 05/16/2013    Procedure: DILATATION AND EVACUATION;  Surgeon: Zelphia CairoGretchen Adkins, MD;  Location: [facility];  Service: Gynecology;  Laterality: N/A;  . Cesarean section N/A 05/12/2014    Procedure: CESAREAN SECTION;  Surgeon: Zelphia CairoGretchen Adkins, MD;  Location: [facility];  Service: Obstetrics;  Laterality: N/A;   History   Social History  . Marital Status: Married    Spouse Name: N/A    Number of Children: N/A  . Years of Education: N/A   Occupational History  . Not on file.   Social History Main Topics  . Smoking status: Never Smoker   . Smokeless tobacco: Never Used  . Alcohol Use: No  . Drug Use: No  . Sexual Activity: Not Currently   Other Topics Concern  . Not on file   Social History Narrative   No current facility-administered medications on file prior to encounter.   Current Outpatient Prescriptions on File Prior to Encounter  Medication Sig Dispense Refill  . amoxicillin-clavulanate (AUGMENTIN) 875-125 MG per  tablet Take 1 tablet by mouth 2 (two) times daily. 14 tablet 0  . ibuprofen (ADVIL,MOTRIN) 600 MG tablet Take 1 tablet (600 mg total) by mouth every 6 (six) hours. 30 tablet 1  . oxyCODONE-acetaminophen (PERCOCET/ROXICET) 5-325 MG per tablet Take 1 tablet by mouth every 4 (four) hours as needed (for pain scale less than 7). 30 tablet 0  . Prenatal Vit-Fe Fumarate-FA (PRENATAL MULTIVITAMIN) TABS tablet Take 1 tablet by mouth daily at 12 noon.    Marland Kitchen. albuterol (PROVENTIL HFA;VENTOLIN HFA) 108 (90 BASE) MCG/ACT inhaler Inhale 2 puffs into the lungs every 6 (six) hours as needed for wheezing or shortness of breath (rescue).     Allergies  Allergen Reactions  . Morphine And Related Shortness Of Breath    ROS: Pertinent items in HPI  OBJECTIVE Blood pressure 134/75, pulse 88, temperature 97.8 F (36.6 C), temperature source Oral, resp. rate 18, SpO2 98 %, currently breastfeeding. GENERAL: Well-developed, well-nourished female in no acute distress.  HEENT: Normocephalic HEART: normal rate RESP: normal effort ABDOMEN: Soft, non-tender EXTREMITIES: Nontender, no edema NEURO: Alert and oriented SPECULUM EXAM: Deferred  LAB RESULTS Results for orders placed or performed during the hospital encounter of 05/19/14 (from the past 24 hour(s))  Urinalysis, Routine w reflex microscopic     Status: Abnormal   Collection Time: 05/19/14  2:58 PM  Result Value Ref Range   Color, Urine AMBER (A)  YELLOW   APPearance HAZY (A) CLEAR   Specific Gravity, Urine 1.015 1.005 - 1.030   pH 6.5 5.0 - 8.0   Glucose, UA NEGATIVE NEGATIVE mg/dL   Hgb urine dipstick LARGE (A) NEGATIVE   Bilirubin Urine NEGATIVE NEGATIVE   Ketones, ur NEGATIVE NEGATIVE mg/dL   Protein, ur 30 (A) NEGATIVE mg/dL   Urobilinogen, UA 0.2 0.0 - 1.0 mg/dL   Nitrite NEGATIVE NEGATIVE   Leukocytes, UA MODERATE (A) NEGATIVE  Urine microscopic-add on     Status: Abnormal   Collection Time: 05/19/14  2:58 PM  Result Value Ref Range    Squamous Epithelial / LPF FEW (A) RARE   WBC, UA 21-50 <3 WBC/hpf   RBC / HPF TOO NUMEROUS TO COUNT <3 RBC/hpf   Bacteria, UA MANY (A) RARE    ASSESSMENT 1. UTI (lower urinary tract infection)     PLAN Consult Dr Vincente Poli Discharge home D/C Augmentin Macrobid 100 mg BID x 7 days Reviewed safety profile of Macrobid and Percocet with breastfeeding at pt request       Follow-up Information    Follow up with Jeani Hawking, MD.   Specialty:  Obstetrics and Gynecology   Why:  As needed   Contact information:   [address] [place] [address] [phone]       Follow up with [facility].   Why:  As needed for emergencies   Contact information:   [address] [id number] mc [place] [address] [phone]      Sharen Counter Certified Nurse-Midwife 05/19/2014  5:17 PM

## 2014-05-19 NOTE — MAU Note (Signed)
Incision clean and dry. Breasts without redness. Has cracked nipples bilaterally but states they are better than previously. Currently breastfeeding.. Does have some pain lower abd when urinates but denies burning with urination. Some pain R mid to upper back but thinks is due to breastfeeding. Had similar pain while breastfeeding first baby.

## 2014-05-19 NOTE — Discharge Instructions (Signed)

## 2014-05-19 NOTE — Progress Notes (Signed)
Sharen CounterLisa Leftwich-Kirby CNM in to discuss d/c plan and medications. Written and verbal d/c instructions given and understanding voiced

## 2014-05-19 NOTE — MAU Note (Signed)
C/S on 1/5, fever of 104.2 @ home today, took ibuprofen @ 1230.   Was seen in MAU on Saturday for fever, taking an antibiotic.  Pt states she has some incision pain but not severe, has soreness in R side.  Has abd pain with urination.

## 2014-05-22 ENCOUNTER — Other Ambulatory Visit: Payer: Self-pay | Admitting: Advanced Practice Midwife

## 2014-05-22 MED ORDER — AMOXICILLIN-POT CLAVULANATE 875-125 MG PO TABS: 1.0000 | ORAL_TABLET | Freq: Two times a day (BID) | ORAL | Status: DC

## 2014-05-22 NOTE — Progress Notes (Signed)
Pt called to obtain urine culture results from 05/19/14.  Urine culture result not found.  Pt reports improvement in symptoms of fever/chills and denies fever greater than 99 in last 24 hours.  She does report some mild chills and feels like she should be more improved after 3 days of abx.  Pt does not desire to return to MAU to give urine sample today.  Called Dr Renaldo FiddlerAdkins to discuss treatment options in absence of culture.  Restart Augmentin BID x 7 days and continue along with Macrobid as prescribed.  Discussed with pt.  Precautions given, reasons to return to MAU. F/U in office next week.

## 2014-06-22 ENCOUNTER — Other Ambulatory Visit: Payer: Self-pay | Admitting: Obstetrics and Gynecology

## 2014-06-23 LAB — CYTOLOGY - PAP

## 2014-07-22 ENCOUNTER — Ambulatory Visit (INDEPENDENT_AMBULATORY_CARE_PROVIDER_SITE_OTHER): Payer: BLUE CROSS/BLUE SHIELD | Admitting: Pulmonary Disease

## 2014-07-22 ENCOUNTER — Encounter: Payer: Self-pay | Admitting: Pulmonary Disease

## 2014-07-22 VITALS — BP 154/82 | HR 67 | Temp 97.0°F | Ht 72.0 in | Wt 233.4 lb

## 2014-07-22 DIAGNOSIS — J45909 Unspecified asthma, uncomplicated: Secondary | ICD-10-CM

## 2014-07-22 MED ORDER — BECLOMETHASONE DIPROPIONATE 80 MCG/ACT IN AERS: 2.0000 | INHALATION_SPRAY | Freq: Two times a day (BID) | RESPIRATORY_TRACT | Status: DC

## 2014-07-22 NOTE — Patient Instructions (Signed)
Get back on qvar twice a day to help prevent remodeling of your airways over the years. Work on weight loss and conditioning. followup with me again in one year if doing well.

## 2014-07-22 NOTE — Progress Notes (Signed)
   Subjective:    Patient ID: [name], female    DOB: 07-Jun-1980, 34 y.o.   MRN: [id number]  HPI The patient comes in today for follow-up of her known asthma. She did not take her maintenance inhaler during the latter part of her pregnancy, and continues to stay off the medication since she has breast-feeding. I have explained to her this is not an issue, and again talked with her about airway inflammation. She denies any significant cough, and has not required her rescue inhaler. She does have more dyspnea on exertion, but gained significant weight with her pregnancy.   Review of Systems  Constitutional: Negative for fever and unexpected weight change.  HENT: Negative for congestion, dental problem, ear pain, nosebleeds, postnasal drip, rhinorrhea, sinus pressure, sneezing, sore throat and trouble swallowing.   Eyes: Negative for redness and itching.  Respiratory: Negative for cough, chest tightness, shortness of breath and wheezing.   Cardiovascular: Negative for palpitations and leg swelling.  Gastrointestinal: Negative for nausea and vomiting.  Genitourinary: Negative for dysuria.  Musculoskeletal: Negative for joint swelling.  Skin: Negative for rash.  Neurological: Negative for headaches.  Hematological: Does not bruise/bleed easily.  Psychiatric/Behavioral: Negative for dysphoric mood. The patient is not nervous/anxious.        Objective:   Physical Exam Overweight female in no acute distress Nose without purulence or discharge noted Neck without lymphadenopathy or thyromegaly Chest with clear breath sounds, no wheezing Cardiac exam with regular rate and rhythm Lower extremities without edema, no cyanosis Alert and oriented, moves all 4 extremities.       Assessment & Plan:

## 2014-07-22 NOTE — Assessment & Plan Note (Signed)
The patient overall is doing well from an asthma standpoint, despite being off her maintenance medication during the latter part of her pregnancy and also currently. She is not having to use her rescue inhaler, but does notice a tickling cough at times. She also has dyspnea on exertion, but this may simply be her weight that was gained during her pregnancy and deconditioning. I have had a long talk with her again about airway inflammation, and the results of remodeling over the years that can result in fixed airway obstruction. I have asked her to get back on her inhaled steroid on a regular basis.

## 2014-09-15 ENCOUNTER — Telehealth: Payer: Self-pay | Admitting: Pulmonary Disease

## 2014-09-15 NOTE — Telephone Encounter (Signed)
lmtcb for pt.  

## 2014-09-16 MED ORDER — BECLOMETHASONE DIPROPIONATE 80 MCG/ACT IN AERS: 2.0000 | INHALATION_SPRAY | Freq: Two times a day (BID) | RESPIRATORY_TRACT | Status: DC

## 2014-09-16 NOTE — Telephone Encounter (Signed)
lmtcb x2 for pt. 

## 2014-09-16 NOTE — Telephone Encounter (Signed)
Pt returned call  [phone]

## 2014-09-16 NOTE — Telephone Encounter (Signed)
[facility]var rx sent [facility] per pt request. Spoke with pt to inform. Nothing further needed.

## 2015-02-18 ENCOUNTER — Ambulatory Visit
Admission: RE | Admit: 2015-02-18 | Discharge: 2015-02-18 | Disposition: A | Discharge status: Home / Self Care | Payer: BLUE CROSS/BLUE SHIELD | Source: Ambulatory Visit | Attending: Family Medicine | Admitting: Family Medicine

## 2015-02-18 ENCOUNTER — Other Ambulatory Visit: Payer: Self-pay | Admitting: Family Medicine

## 2015-02-18 DIAGNOSIS — R52 Pain, unspecified: Secondary | ICD-10-CM

## 2015-03-11 ENCOUNTER — Telehealth: Payer: Self-pay | Admitting: Pulmonary Disease

## 2015-03-11 MED ORDER — ALBUTEROL SULFATE HFA 108 (90 BASE) MCG/ACT IN AERS: 2.0000 | INHALATION_SPRAY | Freq: Four times a day (QID) | RESPIRATORY_TRACT | Status: DC | PRN

## 2015-03-11 NOTE — Telephone Encounter (Signed)
Called spoke with pt. Aware RX sent to the pharm. nothing further needed

## 2015-03-29 ENCOUNTER — Other Ambulatory Visit: Payer: Self-pay | Admitting: *Deleted

## 2015-03-29 MED ORDER — BECLOMETHASONE DIPROPIONATE 80 MCG/ACT IN AERS: 2.0000 | INHALATION_SPRAY | Freq: Two times a day (BID) | RESPIRATORY_TRACT | Status: DC

## 2015-03-31 ENCOUNTER — Encounter: Payer: Self-pay | Admitting: Pulmonary Disease

## 2015-06-26 ENCOUNTER — Other Ambulatory Visit: Payer: Self-pay | Admitting: Pulmonary Disease

## 2015-07-05 LAB — OB RESULTS CONSOLE HIV ANTIBODY (ROUTINE TESTING): HIV: NONREACTIVE

## 2015-07-05 LAB — OB RESULTS CONSOLE ABO/RH: RH Type: POSITIVE

## 2015-07-05 LAB — OB RESULTS CONSOLE RPR: RPR: NONREACTIVE

## 2015-07-05 LAB — OB RESULTS CONSOLE HEPATITIS B SURFACE ANTIGEN: Hepatitis B Surface Ag: NEGATIVE

## 2015-07-05 LAB — OB RESULTS CONSOLE ANTIBODY SCREEN: Antibody Screen: NEGATIVE

## 2015-07-05 LAB — OB RESULTS CONSOLE RUBELLA ANTIBODY, IGM: Rubella: IMMUNE

## 2015-07-22 ENCOUNTER — Ambulatory Visit: Payer: BLUE CROSS/BLUE SHIELD | Admitting: Pulmonary Disease

## 2015-07-23 ENCOUNTER — Ambulatory Visit: Payer: BLUE CROSS/BLUE SHIELD | Admitting: Pulmonary Disease

## 2015-12-28 LAB — OB RESULTS CONSOLE GBS: GBS: POSITIVE

## 2016-01-18 ENCOUNTER — Encounter (HOSPITAL_COMMUNITY): Payer: Self-pay | Admitting: *Deleted

## 2016-01-18 ENCOUNTER — Telehealth (HOSPITAL_COMMUNITY): Payer: Self-pay | Admitting: *Deleted

## 2016-01-18 ENCOUNTER — Other Ambulatory Visit: Payer: Self-pay | Admitting: Certified Nurse Midwife

## 2016-01-18 NOTE — Telephone Encounter (Signed)
Preadmission screen  

## 2016-01-23 ENCOUNTER — Inpatient Hospital Stay (HOSPITAL_COMMUNITY): Admission: RE | Admit: 2016-01-23 | Payer: BLUE CROSS/BLUE SHIELD | Source: Ambulatory Visit

## 2016-01-25 ENCOUNTER — Encounter (HOSPITAL_COMMUNITY)
Admission: AD | Disposition: A | Discharge status: Home / Self Care | Payer: Self-pay | Source: Ambulatory Visit | Attending: Obstetrics & Gynecology

## 2016-01-25 SURGERY — Surgical Case
Anesthesia: Regional

## 2016-01-28 ENCOUNTER — Inpatient Hospital Stay (HOSPITAL_COMMUNITY): Payer: BLUE CROSS/BLUE SHIELD | Admitting: Anesthesiology

## 2016-01-28 ENCOUNTER — Inpatient Hospital Stay (HOSPITAL_COMMUNITY)
Admission: AD | Admit: 2016-01-28 | Discharge: 2016-01-31 | DRG: 766 | Disposition: A | Discharge status: Home / Self Care | Payer: BLUE CROSS/BLUE SHIELD | Source: Ambulatory Visit | Attending: Obstetrics & Gynecology | Admitting: Obstetrics & Gynecology

## 2016-01-28 ENCOUNTER — Encounter (HOSPITAL_COMMUNITY)
Admission: AD | Disposition: A | Discharge status: Home / Self Care | Payer: Self-pay | Source: Ambulatory Visit | Attending: Obstetrics & Gynecology

## 2016-01-28 ENCOUNTER — Encounter (HOSPITAL_COMMUNITY): Payer: Self-pay

## 2016-01-28 DIAGNOSIS — O34211 Maternal care for low transverse scar from previous cesarean delivery: Secondary | ICD-10-CM | POA: Diagnosis present

## 2016-01-28 DIAGNOSIS — O4202 Full-term premature rupture of membranes, onset of labor within 24 hours of rupture: Secondary | ICD-10-CM | POA: Diagnosis present

## 2016-01-28 DIAGNOSIS — Z3A40 40 weeks gestation of pregnancy: Secondary | ICD-10-CM

## 2016-01-28 DIAGNOSIS — O4290 Premature rupture of membranes, unspecified as to length of time between rupture and onset of labor, unspecified weeks of gestation: Secondary | ICD-10-CM

## 2016-01-28 DIAGNOSIS — Z6837 Body mass index (BMI) 37.0-37.9, adult: Secondary | ICD-10-CM | POA: Diagnosis not present

## 2016-01-28 DIAGNOSIS — E669 Obesity, unspecified: Secondary | ICD-10-CM | POA: Diagnosis present

## 2016-01-28 DIAGNOSIS — O99824 Streptococcus B carrier state complicating childbirth: Secondary | ICD-10-CM | POA: Diagnosis present

## 2016-01-28 DIAGNOSIS — O99214 Obesity complicating childbirth: Secondary | ICD-10-CM | POA: Diagnosis present

## 2016-01-28 DIAGNOSIS — Z8249 Family history of ischemic heart disease and other diseases of the circulatory system: Secondary | ICD-10-CM | POA: Diagnosis not present

## 2016-01-28 DIAGNOSIS — J45909 Unspecified asthma, uncomplicated: Secondary | ICD-10-CM | POA: Diagnosis present

## 2016-01-28 DIAGNOSIS — O9952 Diseases of the respiratory system complicating childbirth: Secondary | ICD-10-CM | POA: Diagnosis present

## 2016-01-28 DIAGNOSIS — O3663X0 Maternal care for excessive fetal growth, third trimester, not applicable or unspecified: Principal | ICD-10-CM | POA: Diagnosis present

## 2016-01-28 DIAGNOSIS — O34219 Maternal care for unspecified type scar from previous cesarean delivery: Secondary | ICD-10-CM | POA: Diagnosis present

## 2016-01-28 LAB — TYPE AND SCREEN
ABO/RH(D): O POS
Antibody Screen: NEGATIVE

## 2016-01-28 LAB — CBC
HCT: 37.6 % (ref 36.0–46.0)
Hemoglobin: 12.8 g/dL (ref 12.0–15.0)
MCH: 29.5 pg (ref 26.0–34.0)
MCHC: 34 g/dL (ref 30.0–36.0)
MCV: 86.6 fL (ref 78.0–100.0)
Platelets: 203 10*3/uL (ref 150–400)
RBC: 4.34 MIL/uL (ref 3.87–5.11)
RDW: 14.6 % (ref 11.5–15.5)
WBC: 13 10*3/uL — ABNORMAL HIGH (ref 4.0–10.5)

## 2016-01-28 SURGERY — Surgical Case
Anesthesia: Spinal

## 2016-01-28 MED ORDER — PENICILLIN G POTASSIUM 5000000 UNITS IJ SOLR
2.5000 10*6.[IU] | INTRAVENOUS | Status: DC
  Administered 2016-01-28 (×2): 2.5 10*6.[IU] via INTRAVENOUS
  Filled 2016-01-28 (×4): qty 2.5

## 2016-01-28 MED ORDER — SODIUM CHLORIDE 0.9 % IR SOLN
Status: DC | PRN
  Administered 2016-01-28: 1

## 2016-01-28 MED ORDER — CEFAZOLIN SODIUM-DEXTROSE 2-4 GM/100ML-% IV SOLN
INTRAVENOUS | Status: AC
  Filled 2016-01-28: qty 100

## 2016-01-28 MED ORDER — OXYTOCIN 40 UNITS IN LACTATED RINGERS INFUSION - SIMPLE MED: 2.5000 [IU]/h | INTRAVENOUS | Status: DC

## 2016-01-28 MED ORDER — SCOPOLAMINE 1 MG/3DAYS TD PT72
MEDICATED_PATCH | TRANSDERMAL | Status: DC | PRN
  Administered 2016-01-28: 1 via TRANSDERMAL

## 2016-01-28 MED ORDER — ONDANSETRON HCL 4 MG/2ML IJ SOLN
INTRAMUSCULAR | Status: AC
  Filled 2016-01-28: qty 2

## 2016-01-28 MED ORDER — FENTANYL CITRATE (PF) 100 MCG/2ML IJ SOLN
INTRAMUSCULAR | Status: AC
  Administered 2016-01-28: 50 ug
  Filled 2016-01-28: qty 2

## 2016-01-28 MED ORDER — OXYTOCIN 10 UNIT/ML IJ SOLN
INTRAVENOUS | Status: DC | PRN
  Administered 2016-01-28: 40 [IU] via INTRAVENOUS

## 2016-01-28 MED ORDER — LACTATED RINGERS IV SOLN
INTRAVENOUS | Status: DC | PRN
  Administered 2016-01-28: 21:00:00 via INTRAVENOUS

## 2016-01-28 MED ORDER — SOD CITRATE-CITRIC ACID 500-334 MG/5ML PO SOLN
30.0000 mL | ORAL | Status: DC | PRN
  Administered 2016-01-28: 30 mL via ORAL
  Filled 2016-01-28: qty 15

## 2016-01-28 MED ORDER — PHENYLEPHRINE HCL 10 MG/ML IJ SOLN
INTRAMUSCULAR | Status: DC | PRN
  Administered 2016-01-28: 80 ug via INTRAVENOUS
  Administered 2016-01-28: 40 ug via INTRAVENOUS

## 2016-01-28 MED ORDER — ONDANSETRON HCL 4 MG/2ML IJ SOLN: 4.0000 mg | Freq: Once | INTRAMUSCULAR | Status: DC | PRN

## 2016-01-28 MED ORDER — OXYTOCIN BOLUS FROM INFUSION: 500.0000 mL | Freq: Once | INTRAVENOUS | Status: DC

## 2016-01-28 MED ORDER — ACETAMINOPHEN 325 MG PO TABS: 650.0000 mg | ORAL_TABLET | ORAL | Status: DC | PRN

## 2016-01-28 MED ORDER — PHENYLEPHRINE 40 MCG/ML (10ML) SYRINGE FOR IV PUSH (FOR BLOOD PRESSURE SUPPORT)
PREFILLED_SYRINGE | INTRAVENOUS | Status: AC
Start: 2016-01-28 — End: 2016-01-28
  Filled 2016-01-28: qty 10

## 2016-01-28 MED ORDER — BUPIVACAINE IN DEXTROSE 0.75-8.25 % IT SOLN
INTRATHECAL | Status: DC | PRN
  Administered 2016-01-28: 1.8 mL via INTRATHECAL

## 2016-01-28 MED ORDER — SCOPOLAMINE 1 MG/3DAYS TD PT72
MEDICATED_PATCH | TRANSDERMAL | Status: AC
  Filled 2016-01-28: qty 1

## 2016-01-28 MED ORDER — SODIUM CHLORIDE 0.9% FLUSH: 3.0000 mL | INTRAVENOUS | Status: DC | PRN

## 2016-01-28 MED ORDER — ONDANSETRON HCL 4 MG/2ML IJ SOLN
INTRAMUSCULAR | Status: DC | PRN
  Administered 2016-01-28: 4 mg via INTRAVENOUS

## 2016-01-28 MED ORDER — SODIUM CHLORIDE 0.9 % IV SOLN: 250.0000 mL | INTRAVENOUS | Status: DC | PRN

## 2016-01-28 MED ORDER — LACTATED RINGERS IV SOLN
INTRAVENOUS | Status: DC | PRN
  Administered 2016-01-28: 22:00:00 via INTRAVENOUS

## 2016-01-28 MED ORDER — LACTATED RINGERS IV SOLN: 500.0000 mL | INTRAVENOUS | Status: DC | PRN

## 2016-01-28 MED ORDER — CEFAZOLIN SODIUM-DEXTROSE 2-3 GM-% IV SOLR
INTRAVENOUS | Status: DC | PRN
  Administered 2016-01-28: 2 g via INTRAVENOUS

## 2016-01-28 MED ORDER — PENICILLIN G POTASSIUM 5000000 UNITS IJ SOLR
5.0000 10*6.[IU] | Freq: Once | INTRAVENOUS | Status: AC
  Administered 2016-01-28: 5 10*6.[IU] via INTRAVENOUS
  Filled 2016-01-28: qty 5

## 2016-01-28 MED ORDER — DEXAMETHASONE SODIUM PHOSPHATE 10 MG/ML IJ SOLN
INTRAMUSCULAR | Status: DC | PRN
  Administered 2016-01-28: 10 mg via INTRAVENOUS

## 2016-01-28 MED ORDER — OXYTOCIN 10 UNIT/ML IJ SOLN
INTRAMUSCULAR | Status: AC
  Filled 2016-01-28: qty 4

## 2016-01-28 MED ORDER — FENTANYL CITRATE (PF) 100 MCG/2ML IJ SOLN: 25.0000 ug | INTRAMUSCULAR | Status: DC | PRN

## 2016-01-28 MED ORDER — LIDOCAINE HCL (PF) 1 % IJ SOLN: 30.0000 mL | INTRAMUSCULAR | Status: DC | PRN

## 2016-01-28 MED ORDER — FENTANYL CITRATE (PF) 100 MCG/2ML IJ SOLN
INTRAMUSCULAR | Status: DC | PRN
  Administered 2016-01-28: 10 ug via INTRAVENOUS

## 2016-01-28 MED ORDER — DEXAMETHASONE SODIUM PHOSPHATE 10 MG/ML IJ SOLN
INTRAMUSCULAR | Status: AC
  Filled 2016-01-28: qty 1

## 2016-01-28 MED ORDER — FENTANYL CITRATE (PF) 100 MCG/2ML IJ SOLN
INTRAMUSCULAR | Status: AC
  Filled 2016-01-28: qty 2

## 2016-01-28 MED ORDER — SODIUM CHLORIDE 0.9% FLUSH: 3.0000 mL | Freq: Two times a day (BID) | INTRAVENOUS | Status: DC

## 2016-01-28 SURGICAL SUPPLY — 35 items
APL SKNCLS STERI-STRIP NONHPOA (GAUZE/BANDAGES/DRESSINGS) ×1
BENZOIN TINCTURE PRP APPL 2/3 (GAUZE/BANDAGES/DRESSINGS) ×1 IMPLANT
CHLORAPREP W/TINT 26ML (MISCELLANEOUS) ×2 IMPLANT
CLAMP CORD UMBIL (MISCELLANEOUS) IMPLANT
CLOTH BEACON ORANGE TIMEOUT ST (SAFETY) ×2 IMPLANT
CONTAINER PREFILL 10% NBF 15ML (MISCELLANEOUS) IMPLANT
DRSG OPSITE POSTOP 4X10 (GAUZE/BANDAGES/DRESSINGS) ×2 IMPLANT
ELECT REM PT RETURN 9FT ADLT (ELECTROSURGICAL) ×2
ELECTRODE REM PT RTRN 9FT ADLT (ELECTROSURGICAL) ×1 IMPLANT
EXTRACTOR VACUUM M CUP 4 TUBE (SUCTIONS) IMPLANT
GLOVE BIO SURGEON STRL SZ 6.5 (GLOVE) ×2 IMPLANT
GLOVE BIOGEL PI IND STRL 7.0 (GLOVE) ×2 IMPLANT
GLOVE BIOGEL PI INDICATOR 7.0 (GLOVE) ×2
GOWN STRL REUS W/TWL LRG LVL3 (GOWN DISPOSABLE) ×4 IMPLANT
KIT ABG SYR 3ML LUER SLIP (SYRINGE) IMPLANT
NDL HYPO 25X5/8 SAFETYGLIDE (NEEDLE) IMPLANT
NEEDLE HYPO 22GX1.5 SAFETY (NEEDLE) IMPLANT
NEEDLE HYPO 25X5/8 SAFETYGLIDE (NEEDLE) IMPLANT
NS IRRIG 1000ML POUR BTL (IV SOLUTION) ×2 IMPLANT
PACK C SECTION WH (CUSTOM PROCEDURE TRAY) ×2 IMPLANT
PAD OB MATERNITY 4.3X12.25 (PERSONAL CARE ITEMS) ×2 IMPLANT
PENCIL SMOKE EVAC W/HOLSTER (ELECTROSURGICAL) ×2 IMPLANT
SPONGE LAP 18X18 X RAY DECT (DISPOSABLE) ×1 IMPLANT
STRIP CLOSURE SKIN 1/2X4 (GAUZE/BANDAGES/DRESSINGS) ×1 IMPLANT
SUT MON AB 4-0 PS1 27 (SUTURE) ×2 IMPLANT
SUT PLAIN 0 NONE (SUTURE) IMPLANT
SUT PLAIN 2 0 XLH (SUTURE) ×1 IMPLANT
SUT VIC AB 0 CT1 36 (SUTURE) ×4 IMPLANT
SUT VIC AB 0 CTX 36 (SUTURE) ×6
SUT VIC AB 0 CTX36XBRD ANBCTRL (SUTURE) ×2 IMPLANT
SUT VIC AB 2-0 CT1 27 (SUTURE) ×2
SUT VIC AB 2-0 CT1 TAPERPNT 27 (SUTURE) ×1 IMPLANT
SYR CONTROL 10ML LL (SYRINGE) IMPLANT
TOWEL OR 17X24 6PK STRL BLUE (TOWEL DISPOSABLE) ×2 IMPLANT
TRAY FOLEY CATH SILVER 14FR (SET/KITS/TRAYS/PACK) IMPLANT

## 2016-01-28 NOTE — Progress Notes (Signed)
Met with patient to discuss history, labor labor progress and risks associated with her trial of labor after cesarean section. In short this is a 35 year old G4 P2012 at 41 weeks and 3 days with suspected macrosomia and prior cesarean section 2. Patient had her first C-section for nonreassuring fetal testing at 38 weeks. Patient had her second C-section after failed induction of labor and fetal distress for a macrosomic infant greater than 11 pounds. Throughout this pregnancy patient has been educated about the risks of VBAC after 2 prior C-sections. Patient understands risk of uterine rupture and negative outcomes of mom and baby including up to 10% risk of fetal neurologic events in the setting of uterine rupture. Patient also with current EFW estimated around 4500 g. We discussed risks of shoulder dystocia and permanent brachial plexus injury we discussed risk of fetal hypoxemia during shoulder dystocia or if there is difficult vaginal or cesarean section delivery due to fetal size. We have also discussed the risk of chorioamnionitis given that patient is having prolonged rupture of membranes without active labor. Patient's risk of chorioamnionitis is further heightened by her risk of postpartum endometritis in her last pregnancy. Patient understands all the risks of continuing expectant management at this point and understands that despite expectant management eventual deliver by cesarean section is likely. Patient is aware that a plan C-section carries less risk than a more emergent C-section due to chorioamnionitis fetal distress or CPD. At this point given reassuring fetal and maternal testing well continued expectant management of labor.  Azoria Abbett A. 01/28/2016 5:11 PM

## 2016-01-28 NOTE — Transfer of Care (Signed)
Immediate Anesthesia Transfer of Care Note  Patient: Melanie LainMelanie J Delgado  Procedure(s) Performed: Procedure(s): CESAREAN SECTION (N/A)  Patient Location: PACU  Anesthesia Type:Spinal and Epidural  Level of Consciousness: awake, alert , oriented and patient cooperative  Airway & Oxygen Therapy: Patient Spontanous Breathing  Post-op Assessment: Report given to RN and Post -op Vital signs reviewed and stable  Post vital signs: Reviewed and stable  Last Vitals:  Vitals:   01/28/16 1443 01/28/16 1810  BP: 124/73 124/79  Pulse: 79 83  Resp: 18 18  Temp: 36.5 C 36.9 C    Last Pain:  Vitals:   01/28/16 2100  TempSrc:   PainSc: 7       Patients Stated Pain Goal: 2 (01/28/16 0726)  Complications: No apparent anesthesia complications

## 2016-01-28 NOTE — Anesthesia Pain Management Evaluation Note (Signed)
  CRNA Pain Management Visit Note  Patient: Melanie LainMelanie J Vest, 35 y.o., female  "Hello I am a member of the anesthesia team at [facility]. We have an anesthesia team available at all times to provide care throughout the hospital, including epidural management and anesthesia for C-section. I don't know your plan for the delivery whether it a natural birth, water birth, IV sedation, nitrous supplementation, doula or epidural, but we want to meet your pain goals."   1.Was your pain managed to your expectations on prior hospitalizations?   No   2.What is your expectation for pain management during this hospitalization?     Labor support without medications, Epidural, IV pain meds and Nitrous Oxide  3.How can we help you reach that goal? Open to all methods of pain control. Fearful of epidural due to incomplete block with first c-section. Questions answered.  Record the patient's initial score and the patient's pain goal.   Pain: 4  Pain Goal: 8 [facility] wants you to be able to say your pain was always managed very well.  Terral Cooks 01/28/2016

## 2016-01-28 NOTE — Anesthesia Procedure Notes (Signed)
Epidural Patient location during procedure: OB  Staffing Anesthesiologist: Karie SchwalbeJUDD, Adolpho Meenach Performed: anesthesiologist   Preanesthetic Checklist Completed: patient identified, site marked, surgical consent, pre-op evaluation, timeout performed, IV checked, risks and benefits discussed and monitors and equipment checked  Epidural Patient position: sitting Prep: site prepped and draped and DuraPrep Patient monitoring: continuous pulse ox and blood pressure Approach: midline Location: L3-L4 Injection technique: LOR saline  Needle:  Needle type: Tuohy  Needle gauge: 17 G Needle length: 9 cm and 9 Needle insertion depth: 7 cm Catheter type: closed end flexible Catheter size: 19 Gauge Catheter at skin depth: 12 cm Test dose: negative  Assessment Events: blood not aspirated, injection not painful, no injection resistance, negative IV test and no paresthesia  Additional Notes Patient identified. Risks/Benefits/Options discussed with patient including but not limited to bleeding, infection, nerve damage, paralysis, failed block, incomplete pain control, headache, blood pressure changes, nausea, vomiting, reactions to medication both or allergic, itching and postpartum back pain. Confirmed with bedside nurse the patient's most recent platelet count. Confirmed with patient that they are not currently taking any anticoagulation, have any bleeding history or any family history of bleeding disorders. Patient expressed understanding and wished to proceed. All questions were answered. Sterile technique was used throughout the entire procedure. Please see nursing notes for vital signs. Test dose was given through epidural catheter and negative prior to continuing to dose epidural or start infusion. Warning signs of high block given to the patient including shortness of breath, tingling/numbness in hands, complete motor block, or any concerning symptoms with instructions to call for help. Patient was given  instructions on fall risk and not to get out of bed. All questions and concerns addressed with instructions to call with any issues or inadequate analgesia.    This is a CSE.

## 2016-01-28 NOTE — Brief Op Note (Signed)
01/28/2016  10:41 PM  PATIENT:  Victoriano LainMelanie J Fuhs  35 y.o. female  PRE-OPERATIVE DIAGNOSIS:  Repeat Cesarean Section X2, SROM, PROM, presumed macrosomia  POST-OPERATIVE DIAGNOSIS:  Repeat Cesarean Section X2, PROM, presumed macrosomia  PROCEDURE:  Procedure(s): CESAREAN SECTION (N/A) RCS, 2 layer closure, LOA  SURGEON:  Surgeon(s) and Role:    * Noland FordyceKelly Akiem Urieta, MD - Primary  PHYSICIAN ASSISTANT:   ASSISTANTSRenae Fickle: Paul, CNM   ANESTHESIA:   spinal  EBL:  Total I/O In: 1700 [I.V.:1700] Out: 1000 [Urine:200; Blood:800]  BLOOD ADMINISTERED:none  DRAINS: Urinary Catheter (Foley)   LOCAL MEDICATIONS USED:  NONE  SPECIMEN:  Source of Specimen:  placenta  DISPOSITION OF SPECIMEN:  to patient  COUNTS:  YES  TOURNIQUET:  * No tourniquets in log *  DICTATION: .Note written in EPIC  PLAN OF CARE: Admit to inpatient   PATIENT DISPOSITION:  PACU - hemodynamically stable.   Delay start of Pharmacological VTE agent (>24hrs) due to surgical blood loss or risk of bleeding: yes

## 2016-01-28 NOTE — H&P (Signed)
OB ADMISSION/ HISTORY & PHYSICAL:  Admission Date: 01/28/2016  7:15 AM  Admit Diagnosis: 40 weeks / SROM / previous CS x 2- strong desire for TOLAC / LGA  [name] is a 35 y.o. female presenting for SROM without onset of labor.  Prenatal History: O1H0865G4P2011   EDC : 01/25/2016, by Last Menstrual Period  [facility]  Primary Ob Provider: Fredric MareBailey CNM Prenatal course complicated by previous CS x 2 / LGA / positive GBS  Prenatal Labs: ABO, Rh: O/Positive/-- (02/27 0000) Antibody: Negative (02/27 0000) Rubella: Immune (02/27 0000)  RPR: Nonreactive (02/27 0000)  HBsAg: Negative (02/27 0000)  HIV: Non-reactive (02/27 0000)  GTT: normal GBS: Positive (08/22 0000)   Medical / Surgical History :  Past medical history:  Past Medical History:  Diagnosis Date  . Allergic rhinitis   . Asthma   . Cough      Past surgical history:  Past Surgical History:  Procedure Laterality Date  . CESAREAN SECTION N/A 05/12/2014   Procedure: CESAREAN SECTION;  Surgeon: Zelphia CairoGretchen Adkins, MD;  Location: [facility];  Service: Obstetrics;  Laterality: N/A;  . CESAREAN SECTION CLASSICAL  2012  . CHOLECYSTECTOMY  2010  . DILATION AND EVACUATION N/A 05/16/2013   Procedure: DILATATION AND EVACUATION;  Surgeon: Zelphia CairoGretchen Adkins, MD;  Location: [facility];  Service: Gynecology;  Laterality: N/A;  . WISDOM TOOTH EXTRACTION  1997    Family History:  Family History  Problem Relation Age of Onset  . Hypertension Maternal Aunt      Social History:  reports that she has never smoked. She has never used smokeless tobacco. She reports that she does not drink alcohol or use drugs.   Allergies: Morphine and related    Current Medications at time of admission:  Prior to Admission medications   Medication Sig Start Date End Date Taking? Authorizing Provider  calcium carbonate (TUMS - DOSED IN MG ELEMENTAL CALCIUM) 500 MG chewable tablet Chew 1-2 tablets by mouth daily.   Yes  Historical Provider, MD  albuterol (PROVENTIL HFA;VENTOLIN HFA) 108 (90 BASE) MCG/ACT inhaler Inhale 2 puffs into the lungs every 6 (six) hours as needed for wheezing or shortness of breath (rescue). 03/11/15   Lupita Leashouglas B McQuaid, MD  Prenatal Vit-Fe Fumarate-FA (PRENATAL MULTIVITAMIN) TABS tablet Take 1 tablet by mouth daily at 12 noon.    Historical Provider, MD  QVAR 80 MCG/ACT inhaler USE 2 INHALATIONS TWICE A DAY Patient not taking: Reported on 01/28/2016 06/28/15   Lupita Leashouglas B McQuaid, MD   Review of Systems: Active FM Feeling cramps - no ctx felt so far LOF  / SROM @ 0300 - clear Small pink discharge  Physical Exam:  VS: Blood pressure (!) 141/79, pulse 91, temperature 98 F (36.7 C), resp. rate 18, height 6' (1.829 m), weight 124.8 kg (275 lb 1.3 oz), last menstrual period 04/20/2015, currently breastfeeding.  General: alert and oriented, appears anxious Heart: RRR Lungs: Clear lung fields Abdomen: Gravid, soft and non-tender, non-distended / uterus: gravid Extremities: trace edema  Genitalia / VE: Dilation: 1.5 Effacement (%): 80 Station: -3 Exam by:: Marlinda Mikeanya Cherell Colvin, CNM  FHR: baseline rate 135 / variability moderate / accelerations + / no decelerations TOCO: UI with rare ctx  Assessment: [redacted] weeks gestation PROM with prodromal uterine activity FHR category 1 LGA - EFW 4600gm Previous CS x 2 Positive GBS carrier   Plan:  Admit TOLAC / VBAC consent obtained   [facility]  LGA and 2 previous CS eliminate option to augment labor in any way - if no labor progression, will proceed with repeat CS.  Continuous EFM - ok wireless to ambulate and sit on ball or chair / activity ad lib.  + GBS - PCN prophylaxis initiated  Dr Seymour Bars on-call - will update with status to determine length of time to allow TOLAC   Anticipate patient to display significant emotional distress with a CS decision.  Marlinda Mike CNM, MSN, Asa Lente 01/28/2016, 1610RU

## 2016-01-28 NOTE — Op Note (Signed)
01/28/2016  10:41 PM  PATIENT:  [name]  35 y.o. female  PRE-OPERATIVE DIAGNOSIS:  Repeat Cesarean Section X2, SROM, PROM, presumed macrosomia  POST-OPERATIVE DIAGNOSIS:  Repeat Cesarean Section X2, PROM, presumed macrosomia  PROCEDURE:  Procedure(s): CESAREAN SECTION (N/A) RCS, 2 layer closure, LOA  SURGEON:  Surgeon(s) and Role:    * Noland FordyceKelly Jalyric Kaestner, MD - Primary  PHYSICIAN ASSISTANT:   ASSISTANTSRenae Fickle: Paul, CNM   ANESTHESIA:   spinal  EBL:  Total I/O In: 1700 [I.V.:1700] Out: 1000 [Urine:200; Blood:800]  BLOOD ADMINISTERED:none  DRAINS: Urinary Catheter (Foley)   LOCAL MEDICATIONS USED:  NONE  SPECIMEN:  Source of Specimen:  placenta  DISPOSITION OF SPECIMEN:  to patient  COUNTS:  YES  TOURNIQUET:  * No tourniquets in log *  DICTATION: .Note written in EPIC  PLAN OF CARE: Admit to inpatient   PATIENT DISPOSITION:  PACU - hemodynamically stable.   Delay start of Pharmacological VTE agent (>24hrs) due to surgical blood loss or risk of bleeding: yes   Findings:  @BABYSEXEBC @ infant,  APGAR (1 MIN): 8   APGAR (5 MINS): 9   APGAR (10 MINS):   Normal uterus, tubes and ovaries, normal placenta. 3VC, clear amniotic fluid  EBL: 800 cc Antibiotics:   2g Ancef Complications: none  Indications: This is a 35 y.o. year-old, [id number]  At 6830w3d admitted for PROM. Pt with presumed macrosomia and given 2 prior cesarean sections recommendation has been for repeat cesarean section. After full discussion of risks and benefits patient opted for trial of labor. Patient was granted a cautious expectant management option for trial of labor after cesarean section .  Patient was monitored on the labor floor for about 18 hours with intermittent monitoring per patient's request. After 18 hours patient failed to enter active labor and decision was made to proceed with repeat cesarean section. Risks benefits and alternatives of the procedure were discussed with the patient who  agreed to proceed  Procedure:  After informed consent was obtained the patient was taken to the operating room where spinal anesthesia was initiated.  She was prepped and draped in the normal sterile fashion in dorsal supine position with a leftward tilt.  A foley catheter was in place.  A Pfannenstiel skin incision was made 2 cm above the pubic symphysis in the midline with the scalpel over the old scar.  Dissection was carried down with the Bovie cautery until the fascia was reached. The fascia was incised in the midline. The incision was extended laterally with the Mayo scissors. The inferior aspect of the fascial incision was grasped with the Coker clamps, elevated up and the underlying rectus muscles were dissected off sharply. The superior aspect of the fascial incision was grasped with the Coker clamps elevated up and the underlying rectus muscles were dissected off sharply.  The rectus muscles were adhesed in the midline . Moderate amount of scarring was noted of the rectus muscles anterior abdominal wall and peritoneum. The peritoneum was carefully identified and entered sharply. The bladder was found to be adhesed high to the uterus and also to the left aspect of the peritoneal and adhesions. The peritoneal incision was extended superiorly and inferiorly with good visualization of the bladder. The bladder was dissected sharply to clear the lower uterine segment from the bladder adhesions. The blade was inserted and palpation was done to assess the fetal position and the location of the uterine vessels. The lower segment of the uterus was incised sharply with the scalpel and  extended  bluntly in the cephalo-caudal fashion. Copious amounts of meconium-stained amniotic fluid was noted . The infant was grasped, brought to the incision,  rotated and the infant was delivered with fundal pressure. The cord was clamped and cut after 1 minute of delayed cord clamping. Cord blood was collected for private banking  . The infant was handed off to the waiting pediatrician. The placenta was expressed. The uterus was exteriorized. The uterus was cleared of all clots and debris. The uterine incision was repaired with 0 Vicryl in a running locked fashion.  A second layer of the same suture was used in an imbricating fashion to obtain excellent hemostasis. A third suture was placed at the left angle of the incision for additional hemostasis.  The uterus was then returned to the abdomen, the gutters were cleared of all clots and debris. The uterine incision was reinspected and found to be hemostatic. The peritoneum was evaluated but there was insufficient peritoneum to close. Omental adhesions were noted to the peritoneum and these were released. The omentum was then placed over the uterine and fascial incisions .  The cut muscle edges and the underside of the fascia were inspected and found to be hemostatic. The fascia was closed with 0 Vicryl in a single layer. The subcutaneous tissue was irrigated. Scarpa's layer was closed with a 2-0 plain gut suture. The skin was closed with a 4-0 Monocryl in a single layer. The patient tolerated the procedure well. Sponge lap and needle counts were correct x3 and patient was taken to the recovery room in a stable condition.  [name] A. 01/28/2016 10:52 PM

## 2016-01-28 NOTE — Progress Notes (Signed)
S: Requests cervical exam, ctx unchanged throughout the day, occasional and mild. Unable to rest though, has been in the shower for hydrotherapy.   O: Vitals:   01/28/16 1000 01/28/16 1200 01/28/16 1443 01/28/16 1810  BP:   124/73 124/79  Pulse:   79 83  Resp:   18 18  Temp: 98 F (36.7 C) 98 F (36.7 C) 97.7 F (36.5 C) 98.5 F (36.9 C)  TempSrc:      Weight:      Height:         FHT:  120's, mod var, prior to EFM discontinued, due for FHT now.  UC:   occasional SVE:   Dilation: 2 Effacement (%): 80 Station: Ballotable Exam by:: Renae FicklePaul, CNM Clear AF noted  A / P: TOLAC, LGA PROM, latency  Fetal Wellbeing:  Category I Pain Control:  comfort measures ID: GBS pos, prophylaxis ongoing  Patient has decided to proceed with C/S given no change in cervix for past 12 hours and would like to not be too exhausted.  Active listening and validation of patient's feelings.   Dr. Ernestina PennaFogleman updated with patient decision. Will start prep for surgery.  Neta Mendsaniela C Amariyon Maynes, CNM, MSN 01/28/2016, 6:34 PM

## 2016-01-28 NOTE — Progress Notes (Signed)
G4P2 at 3735w3d, previous C/S x 2, PROM clear AF, GBS (+), strong desire for TOLAC, LGA  [facility] occasional ctx, mostly coupling, not painful. [facility]. Alternates with walking around in room. Occasionally teary during visit. Denies HA/NV/RUQ pain.   O:  Vitals:   01/28/16 0727 01/28/16 0807  BP: 135/90 (!) 141/79  Pulse: 91 91  Resp:  18  Temp: 97.9 F (36.6 C) 98 F (36.7 C)    FHR 120, mod var, + accels, no decels Ctx: rare, mild  GU deferred  A/P: [id number] at 3935w3d, previous C/S x 2,  PROM x 9 hrs,clear AF,  GBS (+),  strong desire for TOLAC,  LGA, EFW 4600 gm  FHT category 1 GBS prophylaxis ongoing Latent early labor  Patient aware of risks associated with PROM, LGA, and hx of C/S x 2, has been extensively counseled. Wants to continue awaiting physiologic labor at least 24 hrs.  Has been up through the night, becoming emotional and tired. Encouraged rest for a couple of hours. May come off CEFM as FHT category 1 and not laboring. Can resume position changes to encourage labor after has rested a bit. Patient agrees.   Will update MD on call w/ patient status.

## 2016-01-28 NOTE — Consult Note (Signed)
Neonatology Note:   Attendance at C-section:    I was asked by Dr. Fogleman to attend this repeat C/S at term due to FTP, fetal macrosomia, failed TOLAC. The mother is a G4P2A1 O pos, GBS positive with known LGA fetus. ROM 19 hours prior to delivery, fluid with thin meconium. Mother got Pen G for 12 hours prior to delivery and was afebrile during labor. Infant vigorous with good spontaneous cry and tone. Needed only minimal bulb suctioning for light green fluid. Ap 8/9. Lungs clear to ausc in DR. To CN to care of Pediatrician.   Melanie Hays C. Glendia Olshefski, MD 

## 2016-01-28 NOTE — Anesthesia Preprocedure Evaluation (Signed)
Anesthesia Evaluation  Patient identified by MRN, date of birth, ID band Patient awake    Reviewed: Allergy & Precautions, NPO status , Patient's Chart, lab work & pertinent test results  History of Anesthesia Complications Negative for: history of anesthetic complications  Airway Mallampati: III  TM Distance: >3 FB Neck ROM: Full    Dental no notable dental hx. (+) Dental Advisory Given   Pulmonary asthma ,    Pulmonary exam normal breath sounds clear to auscultation       Cardiovascular negative cardio ROS Normal cardiovascular exam Rhythm:Regular Rate:Normal     Neuro/Psych negative neurological ROS  negative psych ROS   GI/Hepatic negative GI ROS, Neg liver ROS,   Endo/Other  obesity  Renal/GU negative Renal ROS  negative genitourinary   Musculoskeletal negative musculoskeletal ROS (+)   Abdominal   Peds negative pediatric ROS (+)  Hematology negative hematology ROS (+)   Anesthesia Other Findings   Reproductive/Obstetrics (+) Pregnancy                             Anesthesia Physical Anesthesia Plan  ASA: II  Anesthesia Plan: Spinal   Post-op Pain Management:    Induction:   Airway Management Planned:   Additional Equipment:   Intra-op Plan:   Post-operative Plan:   Informed Consent: I have reviewed the patients History and Physical, chart, labs and discussed the procedure including the risks, benefits and alternatives for the proposed anesthesia with the patient or authorized representative who has indicated his/her understanding and acceptance.   Dental advisory given  Plan Discussed with: CRNA  Anesthesia Plan Comments:         Anesthesia Quick Evaluation

## 2016-01-29 ENCOUNTER — Encounter (HOSPITAL_COMMUNITY): Payer: Self-pay | Admitting: *Deleted

## 2016-01-29 LAB — CBC
HCT: 34.2 % — ABNORMAL LOW (ref 36.0–46.0)
Hemoglobin: 11.7 g/dL — ABNORMAL LOW (ref 12.0–15.0)
MCH: 29.1 pg (ref 26.0–34.0)
MCHC: 34.2 g/dL (ref 30.0–36.0)
MCV: 85.1 fL (ref 78.0–100.0)
Platelets: 196 10*3/uL (ref 150–400)
RBC: 4.02 MIL/uL (ref 3.87–5.11)
RDW: 14.5 % (ref 11.5–15.5)
WBC: 15.2 10*3/uL — ABNORMAL HIGH (ref 4.0–10.5)

## 2016-01-29 LAB — RPR: RPR Ser Ql: NONREACTIVE

## 2016-01-29 MED ORDER — MENTHOL 3 MG MT LOZG: 1.0000 | LOZENGE | OROMUCOSAL | Status: DC | PRN

## 2016-01-29 MED ORDER — LACTATED RINGERS IV SOLN
INTRAVENOUS | Status: DC
  Administered 2016-01-29 (×2): via INTRAVENOUS

## 2016-01-29 MED ORDER — PRENATAL MULTIVITAMIN CH
1.0000 | ORAL_TABLET | Freq: Every day | ORAL | Status: DC
  Administered 2016-01-29 – 2016-01-31 (×3): 1 via ORAL
  Filled 2016-01-29 (×3): qty 1

## 2016-01-29 MED ORDER — OXYCODONE-ACETAMINOPHEN 5-325 MG PO TABS
1.0000 | ORAL_TABLET | ORAL | Status: DC | PRN
  Administered 2016-01-30: 1 via ORAL
  Administered 2016-01-30: 2 via ORAL
  Administered 2016-01-30 – 2016-01-31 (×3): 1 via ORAL
  Filled 2016-01-29 (×4): qty 1
  Filled 2016-01-29: qty 2

## 2016-01-29 MED ORDER — OXYTOCIN 40 UNITS IN LACTATED RINGERS INFUSION - SIMPLE MED: 2.5000 [IU]/h | INTRAVENOUS | Status: AC

## 2016-01-29 MED ORDER — ONDANSETRON HCL 4 MG/2ML IJ SOLN
4.0000 mg | Freq: Four times a day (QID) | INTRAMUSCULAR | Status: DC | PRN
Start: 2016-01-29 — End: 2016-01-30
  Administered 2016-01-29: 4 mg via INTRAVENOUS
  Filled 2016-01-29: qty 2

## 2016-01-29 MED ORDER — DIBUCAINE 1 % RE OINT: 1.0000 "application " | TOPICAL_OINTMENT | RECTAL | Status: DC | PRN

## 2016-01-29 MED ORDER — SENNOSIDES-DOCUSATE SODIUM 8.6-50 MG PO TABS
2.0000 | ORAL_TABLET | ORAL | Status: DC
  Administered 2016-01-29 – 2016-01-31 (×2): 2 via ORAL
  Filled 2016-01-29 (×2): qty 2

## 2016-01-29 MED ORDER — SIMETHICONE 80 MG PO CHEW: 80.0000 mg | CHEWABLE_TABLET | ORAL | Status: DC | PRN

## 2016-01-29 MED ORDER — TETANUS-DIPHTH-ACELL PERTUSSIS 5-2.5-18.5 LF-MCG/0.5 IM SUSP: 0.5000 mL | Freq: Once | INTRAMUSCULAR | Status: DC

## 2016-01-29 MED ORDER — IBUPROFEN 600 MG PO TABS
600.0000 mg | ORAL_TABLET | Freq: Four times a day (QID) | ORAL | Status: DC
  Administered 2016-01-29 – 2016-01-31 (×10): 600 mg via ORAL
  Filled 2016-01-29 (×10): qty 1

## 2016-01-29 MED ORDER — ZOLPIDEM TARTRATE 5 MG PO TABS: 5.0000 mg | ORAL_TABLET | Freq: Every evening | ORAL | Status: DC | PRN

## 2016-01-29 MED ORDER — ACETAMINOPHEN 325 MG PO TABS: 650.0000 mg | ORAL_TABLET | ORAL | Status: DC | PRN

## 2016-01-29 MED ORDER — WITCH HAZEL-GLYCERIN EX PADS: 1.0000 "application " | MEDICATED_PAD | CUTANEOUS | Status: DC | PRN

## 2016-01-29 MED ORDER — SIMETHICONE 80 MG PO CHEW
80.0000 mg | CHEWABLE_TABLET | ORAL | Status: DC
  Administered 2016-01-29 – 2016-01-31 (×2): 80 mg via ORAL
  Filled 2016-01-29 (×2): qty 1

## 2016-01-29 MED ORDER — SIMETHICONE 80 MG PO CHEW
80.0000 mg | CHEWABLE_TABLET | Freq: Three times a day (TID) | ORAL | Status: DC
  Administered 2016-01-29 – 2016-01-31 (×7): 80 mg via ORAL
  Filled 2016-01-29 (×7): qty 1

## 2016-01-29 MED ORDER — SODIUM CHLORIDE 0.9% FLUSH: 9.0000 mL | INTRAVENOUS | Status: DC | PRN

## 2016-01-29 MED ORDER — COCONUT OIL OIL: 1.0000 "application " | TOPICAL_OIL | Status: DC | PRN

## 2016-01-29 MED ORDER — FENTANYL 40 MCG/ML IV SOLN
INTRAVENOUS | Status: DC
Start: 2016-01-29 — End: 2016-01-30
  Administered 2016-01-29: 0.5 ug via INTRAVENOUS
  Administered 2016-01-29: 6.38 ug via INTRAVENOUS
  Administered 2016-01-29: 130 ug via INTRAVENOUS
  Administered 2016-01-29: 1.2 ug via INTRAVENOUS
  Administered 2016-01-29: 01:00:00 via INTRAVENOUS
  Administered 2016-01-30: 70 ug via INTRAVENOUS
  Administered 2016-01-30: 20 ug via INTRAVENOUS
  Administered 2016-01-30: 6.75 ug via INTRAVENOUS
  Filled 2016-01-29: qty 25

## 2016-01-29 MED ORDER — NALOXONE HCL 0.4 MG/ML IJ SOLN: 0.4000 mg | INTRAMUSCULAR | Status: DC | PRN

## 2016-01-29 NOTE — Anesthesia Postprocedure Evaluation (Signed)
Anesthesia Post Note  Patient: [name]  Procedure(s) Performed: Procedure(s) (LRB): CESAREAN SECTION (N/A)  Patient location during evaluation: Mother Baby Anesthesia Type: Epidural Level of consciousness: awake and alert, oriented and patient cooperative Pain management: pain level controlled Vital Signs Assessment: post-procedure vital signs reviewed and stable Respiratory status: spontaneous breathing Cardiovascular status: stable Postop Assessment: no headache, epidural receding, patient able to bend at knees and no signs of nausea or vomiting Anesthetic complications: no Comments: Pain score 5..the patient just dosed PCA.     Last Vitals:  Vitals:   01/29/16 0409 01/29/16 0519  BP: 122/72   Pulse: 80   Resp:  20  Temp:      Last Pain:  Vitals:   01/29/16 0519  TempSrc:   PainSc: 3    Pain Goal: Patients Stated Pain Goal: 4 (01/29/16 0345)               Merrilyn PumaWRINKLE,Tyjanae Bartek

## 2016-01-29 NOTE — Lactation Note (Signed)
This note was copied from a baby's chart. Lactation Consultation Note  Patient Name: Melanie Colonel BaldMelanie Lumadue WUJWJ'XToday's Date: 01/29/2016 Reason for consult: Initial assessment Mom reports baby has latched well few times. Baby asleep at this visit and Mom declined assist with latch. Basic teaching reviewed, encouraged to Bf with feeding ques, 8-12 times or more in 24 hours, cluster feeding reviewed. Lactation brochure left for review, advised of OP services and support group. Encouraged to call for assist as desired.   Maternal Data Has patient been taught Hand Expression?: No (Mom experienced Bf and reports she knows how to hand express)  Feeding Feeding Type: Breast Fed Length of feed: 7 min  LATCH Score/Interventions                      Lactation Tools Discussed/Used WIC Program: No   Consult Status Consult Status: Follow-up Date: 01/30/16 Follow-up type: In-patient    [name], [name] 01/29/2016, 6:04 PM

## 2016-01-29 NOTE — Progress Notes (Signed)
Patient ID: [place] CraneMelanie J Mulford, female   DOB: July 20, 1980, 35 y.o.   MRN: [id number] Subjective: POD# 1 Information for the patient's newborn:  Mattioli, Girl [name] [[id number]][[id number]]  female   Baby name: Vic RipperCaroline Rose  Reports feeling OK. Pain earlier with standing, very worried about post-op pain BC of previous experience. So far, PCA narcotic helping.  Feeding: breast Patient reports tolerating PO.  Breast symptoms: none Denies HA/SOB/C/P/N/V/dizziness. Flatus present. She reports vaginal bleeding as normal, without clots.  Foley cath in place, SCD's on.  Objective:   VS:    Vitals:   01/29/16 0519 01/29/16 0745 01/29/16 1000 01/29/16 1013  BP:  121/61    Pulse:  63    Resp: 20 14  (!) 22  Temp:  97.7 F (36.5 C)    TempSrc:  Oral    SpO2: 97% 97% 98% 98%  Weight:      Height:         Intake/Output Summary (Last 24 hours) at 01/29/16 1250 Last data filed at 01/29/16 0519  Gross per 24 hour  Intake             1870 ml  Output             3260 ml  Net            -1390 ml        Recent Labs  01/28/16 0755 01/29/16 0515  WBC 13.0* 15.2*  HGB 12.8 11.7*  HCT 37.6 34.2*  PLT 203 196     Blood type: --/--/O POS (09/22 0755)  Rubella: Immune (02/27 0000)     Physical Exam:  General: alert, cooperative and no distress CV: Regular rate and rhythm Resp: clear Abdomen: soft, NT, BS present, + gas dist.  Incision: clean, dry and intact Uterine Fundus: firm, below umbilicus, nontender Lochia: minimal Ext: extremities normal, atraumatic, no cyanosis or edema      Assessment/Plan: 35 y.o.   POD# 1. [id number]                  Active Problems:   Hx of cesarean section x 2 complicating pregnancy   PROM (premature rupture of membranes)   Previous cesarean delivery, antepartum Macrosomia  Doing well, stable.  DC foley cath   Will transition off PCA narcotic to PO percocet, used in the past w/o allergic reaction           Advance diet as tolerated Encourage rest  when baby rests Breastfeeding support Encourage to ambulate Routine post-op care  Neta Mendsaniela C Paul, CNM, MSN 01/29/2016, 12:50 PM

## 2016-01-29 NOTE — Anesthesia Postprocedure Evaluation (Signed)
Anesthesia Post Note  Patient: Melanie LainMelanie J Parkison  Procedure(s) Performed: Procedure(s) (LRB): CESAREAN SECTION (N/A)  Patient location during evaluation: PACU Anesthesia Type: Epidural Level of consciousness: awake and alert Pain management: pain level controlled Vital Signs Assessment: post-procedure vital signs reviewed and stable Respiratory status: spontaneous breathing, nonlabored ventilation and respiratory function stable Cardiovascular status: stable Postop Assessment: no headache, no backache and epidural receding Anesthetic complications: no     Last Vitals:  Vitals:   01/29/16 0409 01/29/16 0519  BP: 122/72   Pulse: 80   Resp:  20  Temp:      Last Pain:  Vitals:   01/29/16 0519  TempSrc:   PainSc: 3    Pain Goal: Patients Stated Pain Goal: 4 (01/29/16 0345)               Consandra Laske JENNETTE

## 2016-01-29 NOTE — Addendum Note (Signed)
Addendum  created 01/29/16 [id number] by Angela Adamana G Kyo Cocuzza, CRNA   Sign clinical note

## 2016-01-30 NOTE — Progress Notes (Signed)
Patient ID: [name], female   DOB: [birth year], 35 y.o.   MRN: [id number] Subjective: POD# 2 Information for the patient's newborn:  Maillet, Girl [name] [[id number]][[id number]]  female  Baby name: Vic RipperCaroline Rose  Reports feeling tired, baby up during night fussy.  Feeding: breast Patient reports tolerating PO.  Breast symptoms: (+) colostrum Pain controlled with PCA fentanyl Denies HA/SOB/C/P/N/V/dizziness. Flatus minimal. She reports vaginal bleeding as normal, without clots.  She is ambulating, urinating without difficulty.   Has been up in chair for minimum amount of time  Objective:   VS:    Vitals:   01/29/16 2214 01/30/16 0309 01/30/16 0629 01/30/16 0631  BP:  110/75  (!) 116/58  Pulse:  65  68  Resp: 20 20 19 19   Temp:  98.2 F (36.8 C)  98.5 F (36.9 C)  TempSrc:  Oral    SpO2: 99% 100%    Weight:      Height:         Intake/Output Summary (Last 24 hours) at 01/30/16 1006 Last data filed at 01/29/16 1900  Gross per 24 hour  Intake             1980 ml  Output             2765 ml  Net             -785 ml        Recent Labs  01/28/16 0755 01/29/16 0515  WBC 13.0* 15.2*  HGB 12.8 11.7*  HCT 37.6 34.2*  PLT 203 196     Blood type: --/--/O POS (09/22 0755)  Rubella: Immune (02/27 0000)     Physical Exam:  General: alert, cooperative and no distress CV: Regular rate and rhythm Resp: clear Abdomen: soft, nontender, decreased bowel sounds Incision: clean, dry and intact Uterine Fundus: firm, below umbilicus, nontender Lochia: minimal Ext: extremities normal, atraumatic, no cyanosis or edema   Assessment/Plan: 35 y.o.   POD# 2. [id number]                  Principal Problem:   Postpartum care following cesarean delivery (9/22)   Doing well, stable.    Will DC PCA today, start Percocet PRN Warm fluids to encourage gut motility Breastfeeding support Encourage to ambulate, may use abdominal binder Routine post-op care  Neta Mendsaniela C Paul, CNM,  MSN 01/30/2016, 10:06 AM

## 2016-01-30 NOTE — Lactation Note (Signed)
This note was copied from a baby's chart. Lactation Consultation Note  Patient Name: [name] WUJWJ'XToday's Date: 01/30/2016 Reason for consult: Follow-up assessment Baby at 45 hr of life. Mom denies breast or nipple pain, voiced no concerns. Discussed baby behavior, feeding frequency, baby belly size, voids, wt loss, breast changes, and nipple care. Mom stated she can manually express and has spoon in room. She is aware of lactation services and support group.    Maternal Data    Feeding Feeding Type: Breast Fed Length of feed: 15 min  LATCH Score/Interventions Latch: Grasps breast easily, tongue down, lips flanged, rhythmical sucking.  Audible Swallowing: None Intervention(s): Skin to skin;Hand expression  Type of Nipple: Everted at rest and after stimulation  Comfort (Breast/Nipple): Soft / non-tender     Hold (Positioning): No assistance needed to correctly position infant at breast.  LATCH Score: 8  Lactation Tools Discussed/Used     Consult Status Consult Status: Follow-up Date: 01/31/16 Follow-up type: In-patient    [name] 01/30/2016, 7:05 PM

## 2016-01-31 ENCOUNTER — Encounter (HOSPITAL_COMMUNITY): Payer: Self-pay | Admitting: Obstetrics

## 2016-01-31 MED ORDER — OXYCODONE-ACETAMINOPHEN 5-325 MG PO TABS: 1.0000 | ORAL_TABLET | ORAL | 0 refills | Status: DC | PRN

## 2016-01-31 MED ORDER — IBUPROFEN 600 MG PO TABS: 600.0000 mg | ORAL_TABLET | Freq: Four times a day (QID) | ORAL | 0 refills | Status: DC

## 2016-01-31 NOTE — Discharge Summary (Signed)
POSTOPERATIVE DISCHARGE SUMMARY:  Patient ID: [name] MRN: [id number] DOB/AGE: 01-06-1981 35 y.o.  Admit date: 01/28/2016 Admission Diagnoses: PROM / Postterm Pregnancy  Discharge date:  01/31/2016 Discharge Diagnoses: S/P Repeat C/S due to PROM / Failed VBAC / Presumed Macrosomia on 01/28/2016  Prenatal history: [id number]   EDC : 01/25/2016, by Last Menstrual Period  [facility] 32.[facility] Primary provider : Marlinda Mikeanya Bailey, CNM Prenatal course complicated by previous C/S x 2 / previous macrosomic infant   Prenatal Labs: ABO, Rh: --/--/O POS (09/22 0755) Antibody: NEG (09/22 0755) Rubella: Immune (02/27 0000)  RPR: Non Reactive (09/22 0755)  HBsAg: Negative (02/27 0000)  HIV: Non-reactive (02/27 0000)  GTT : Normal - 73  GBS: Positive (08/22 0000)   Medical / Surgical History :  Past medical history:  Past Medical History:  Diagnosis Date  . Allergic rhinitis   . Asthma   . Cough     Past surgical history:  Past Surgical History:  Procedure Laterality Date  . CESAREAN SECTION N/A 05/12/2014   Procedure: CESAREAN SECTION;  Surgeon: Zelphia CairoGretchen Adkins, MD;  Location: [facility];  Service: Obstetrics;  Laterality: N/A;  . CESAREAN SECTION N/A 01/28/2016   Procedure: CESAREAN SECTION;  Surgeon: Noland FordyceKelly Fogleman, MD;  [facility];  Service: Obstetrics;  Laterality: N/A;  . CESAREAN SECTION CLASSICAL  2012  . CHOLECYSTECTOMY  2010  . DILATION AND EVACUATION N/A 05/16/2013   Procedure: DILATATION AND EVACUATION;  Surgeon: Zelphia CairoGretchen Adkins, MD;  Location: [facility];  Service: Gynecology;  Laterality: N/A;  . WISDOM TOOTH EXTRACTION  1997    Family History:  Family History  Problem Relation Age of Onset  . Hypertension Maternal Aunt     Social History:  reports that she has never smoked. She has never used smokeless tobacco. She reports that she does not drink alcohol or use drugs.  Allergies: Morphine and related   Current  Medications at time of admission:  Prior to Admission medications   Medication Sig Start Date End Date Taking? Authorizing Provider  calcium carbonate (TUMS - DOSED IN MG ELEMENTAL CALCIUM) 500 MG chewable tablet Chew 1-2 tablets by mouth daily.   Yes Historical Provider, MD  albuterol (PROVENTIL HFA;VENTOLIN HFA) 108 (90 BASE) MCG/ACT inhaler Inhale 2 puffs into the lungs every 6 (six) hours as needed for wheezing or shortness of breath (rescue). 03/11/15   Lupita Leashouglas B McQuaid, MD  ibuprofen (ADVIL,MOTRIN) 600 MG tablet Take 1 tablet (600 mg total) by mouth every 6 (six) hours. 01/31/16   Raelyn Moraolitta Shyane Fossum, CNM  oxyCODONE-acetaminophen (PERCOCET/ROXICET) 5-325 MG tablet Take 1-2 tablets by mouth every 4 (four) hours as needed for moderate pain. 01/31/16   Raelyn Moraolitta Reon Hunley, CNM  Prenatal Vit-Fe Fumarate-FA (PRENATAL MULTIVITAMIN) TABS tablet Take 1 tablet by mouth daily at 12 noon.    Historical Provider, MD  QVAR 80 MCG/ACT inhaler USE 2 INHALATIONS TWICE A DAY Patient not taking: Reported on 01/28/2016 06/28/15   Lupita Leashouglas B McQuaid, MD    Intrapartum Course:  Admitted for PROM without onset of labor / labor progression to 2cm dilation with abnormal labor curve Pain management: epidural Complicated by: no cervical change in > 12 hrs   Procedures: Cesarean section delivery on 01/28/2016 with delivery of viable female newborn by Dr Ernestina PennaFogleman   See operative report for further details APGAR (1 MIN): 8   APGAR (5 MINS): 9    Postoperative / postpartum course:  Uncomplicated with discharge on POD 3  Complicated by: none noted  Discharge Instructions:  Discharged Condition: stable  Activity: pelvic rest and postoperative restrictions x 2   Diet: routine  Medications:    Medication List    TAKE these medications   albuterol 108 (90 Base) MCG/ACT inhaler Commonly known as:  PROVENTIL HFA;VENTOLIN HFA Inhale 2 puffs into the lungs every 6 (six) hours as needed for wheezing or shortness of breath  (rescue).   calcium carbonate 500 MG chewable tablet Commonly known as:  TUMS - dosed in mg elemental calcium Chew 1-2 tablets by mouth daily.   ibuprofen 600 MG tablet Commonly known as:  ADVIL,MOTRIN Take 1 tablet (600 mg total) by mouth every 6 (six) hours.   oxyCODONE-acetaminophen 5-325 MG tablet Commonly known as:  PERCOCET/ROXICET Take 1-2 tablets by mouth every 4 (four) hours as needed for moderate pain.   prenatal multivitamin Tabs tablet Take 1 tablet by mouth daily at 12 noon.   QVAR 80 MCG/ACT inhaler Generic drug:  beclomethasone USE 2 INHALATIONS TWICE A DAY       Wound Care: keep clean and dry / remove honeycomb POD 5 Postpartum Instructions: [facility] discharge booklet - instructions reviewed  Discharge to: Home  Follow up :  [facility] in 2 weeks for interval visit with Marlinda Mike, CNM for PPD evaluation [facility] in 6 weeks for routine postpartum visit with Marlinda Mike, CNM                Signed: Raelyn Mora, Judie Petit MSN, CNM 01/31/2016, 11:16 AM

## 2016-01-31 NOTE — Lactation Note (Signed)
This note was copied from a baby's chart. Lactation Consultation Note  Patient Name: Melanie Colonel BaldMelanie Charity ZOXWR'UToday's Date: Delgado Reason for consult: Follow-up assessment;Infant weight loss (8% weight loss , per mom baby recently breast fed )  Mom is and experienced breast feeder of 2 other children and just stopped breast feeding this past January. 1st baby - engorgement issues , 2nd baby none. Per mom presently nipples sensitive, latch is comfort comfortable.  Per mom aware of what to do when milk comes in and engorgement prevention and tx.  LC mentioned to mom since it is her 3 rd baby breast feeding , potentially milk volume can be greater every baby .  If she is really full to start - release 1st breast - hand expressing or pumping so the baby will get to the creamy fatty  Milk quicker and the weight will increase quicker and then plan on offering the 2nd breast.  Mom receptive to information. Mother informed of post-discharge support and given phone number to the lactation department, including services for phone call assistance; out-patient appointments; and breastfeeding support group. List of other breastfeeding resources in the community given in the handout. Encouraged mother to call for problems or concerns related to breastfeeding.   Maternal Data    Feeding Feeding Type: Breast Fed Length of feed: 25 min (per mom )  LATCH Score/Interventions Latch: Grasps breast easily, tongue down, lips flanged, rhythmical sucking.  Audible Swallowing: Spontaneous and intermittent  Type of Nipple: Everted at rest and after stimulation  Comfort (Breast/Nipple): Soft / non-tender     Hold (Positioning): No assistance needed to correctly position infant at breast.  LATCH Score: 10  Lactation Tools Discussed/Used     Consult Status Consult Status: Complete Date: 01/31/16    Kathrin Greathouseorio, Deng Kemler Ann Delgado, 10:34 AM

## 2016-01-31 NOTE — Discharge Instructions (Signed)
Breast Pumping Tips °If you are breastfeeding, there may be times when you cannot feed your baby directly. Returning to work or going on a trip are common examples. Pumping allows you to store breast milk and feed it to your baby later.  °You may not get much milk when you first start to pump. Your breasts should start to make more after a few days. If you pump at the times you usually feed your baby, you may be able to keep making enough milk to feed your baby without also using formula. The more often you pump, the more milk you will produce.  °WHEN SHOULD I PUMP?  °· You can begin to pump soon after delivery. However, some experts recommend waiting about 4 weeks before giving your infant a bottle to make sure breastfeeding is going well.  °· If you plan to return to work, begin pumping a few weeks before. This will help you develop techniques that work best for you. It also lets you build up a supply of breast milk.   °· When you are with your infant, feed on demand and pump after each feeding.   °· When you are away from your infant for several hours, pump for about 15 minutes every 2-3 hours. Pump both breasts at the same time if you can.   °· If your infant has a formula feeding, make sure to pump around the same time.     °· If you drink any alcohol, wait 2 hours before pumping.   °HOW DO I PREPARE TO PUMP? °Your let-down reflex is the natural reaction to stimulation that makes your breast milk flow. It is easier to stimulate this reflex when you are relaxed. Find relaxation techniques that work for you. If you have difficulty with your let-down reflex, try these methods:  °· Smell one of your infant's blankets or an item of clothing.   °· Look at a picture or video of your infant.   °· Sit in a quiet, private space.   °· Massage the breast you plan to pump.   °· Place soothing warmth on the breast.   °· Play relaxing music.   °WHAT ARE SOME GENERAL BREAST PUMPING TIPS? °· Wash your hands before you pump. You  do not need to wash your nipples or breasts. °· There are three ways to pump. °· You can use your hand to massage and compress your breast. °· You can use a handheld manual pump. °· You can use an electric pump.   °· Make sure the suction cup (flange) on the breast pump is the right size. Place the flange directly over the nipple. If it is the wrong size or placed the wrong way, it may be painful and cause nipple damage.   °· If pumping is uncomfortable, apply a small amount of purified or modified lanolin to your nipple and areola. °· If you are using an electric pump, adjust the speed and suction power to be more comfortable. °· If pumping is painful or if you are not getting very much milk, you may need a different type of pump. A lactation consultant can help you determine what type of pump to use.   °· Keep a full water bottle near you at all times. Drinking lots of fluid helps you make more milk.  °· You can store your milk to use later. Pumped breast milk can be stored in a sealable, sterile container or plastic bag. Label all stored breast milk with the date you pumped it. °· Milk can stay out at room temperature for up to 8 hours. °·   You can store your milk in the refrigerator for up to 8 days. °· You can store your milk in the freezer for 3 months. Thaw frozen milk using warm water. Do not put it in the microwave. °· Do not smoke. Smoking can lower your milk supply and harm your infant. If you need help quitting, ask your health care provider to recommend a program.   °WHEN SHOULD I CALL MY HEALTH CARE PROVIDER OR A LACTATION CONSULTANT? °· You are having trouble pumping. °· You are concerned that you are not making enough milk. °· You have nipple pain, soreness, or redness. °· You want to use birth control. Birth control pills may lower your milk supply. Talk to your health care provider about your options. °  °This information is not intended to replace advice given to you by your health care provider.  Make sure you discuss any questions you have with your health care provider. °  °Document Released: 10/12/2009 Document Revised: 04/29/2013 Document Reviewed: 02/14/2013 °[employer]vier Interactive Patient Education ©2016 [employer] °Postpartum Depression and Baby Blues °The postpartum period begins right after the birth of a baby. During this time, there is often a great amount of joy and excitement. It is also a time of many changes in the life of the parents. Regardless of how many times a mother gives birth, each child brings new challenges and dynamics to the family. It is not unusual to have feelings of excitement along with confusing shifts in moods, emotions, and thoughts. All mothers are at risk of developing postpartum depression or the "baby blues." These mood changes can occur right after giving birth, or they may occur many months after giving birth. The baby blues or postpartum depression can be mild or severe. Additionally, postpartum depression can go away rather quickly, or it can be a long-term condition.  °CAUSES °Raised hormone levels and the rapid drop in those levels are thought to be a main cause of postpartum depression and the baby blues. A number of hormones change during and after pregnancy. Estrogen and progesterone usually decrease right after the delivery of your baby. The levels of thyroid hormone and various cortisol steroids also rapidly drop. Other factors that play a role in these mood changes include major life events and genetics.  °RISK FACTORS °If you have any of the following risks for the baby blues or postpartum depression, know what symptoms to watch out for during the postpartum period. Risk factors that may increase the likelihood of getting the baby blues or postpartum depression include: °· Having a personal or family history of depression.   °· Having depression while being pregnant.   °· Having premenstrual mood issues or mood issues related to oral  contraceptives. °· Having a lot of life stress.   °· Having marital conflict.   °· Lacking a social support network.   °· Having a baby with special needs.   °· Having health problems, such as diabetes.   °SIGNS AND SYMPTOMS °Symptoms of baby blues include: °· Brief changes in mood, such as going from extreme happiness to sadness. °· Decreased concentration.   °· Difficulty sleeping.   °· Crying spells, tearfulness.   °· Irritability.   °· Anxiety.   °Symptoms of postpartum depression typically begin within the first month after giving birth. These symptoms include: °· Difficulty sleeping or excessive sleepiness.   °· Marked weight loss.   °· Agitation.   °· Feelings of worthlessness.   °· Lack of interest in activity or food.   °Postpartum psychosis is a very serious condition and can be dangerous. Fortunately, it is   rare. Displaying any of the following symptoms is cause for immediate medical attention. Symptoms of postpartum psychosis include:  °· Hallucinations and delusions.   °· Bizarre or disorganized behavior.   °· Confusion or disorientation.   °DIAGNOSIS  °A diagnosis is made by an evaluation of your symptoms. There are no medical or lab tests that lead to a diagnosis, but there are various questionnaires that a health care provider may use to identify those with the baby blues, postpartum depression, or psychosis. Often, a screening tool called the Edinburgh Postnatal Depression Scale is used to diagnose depression in the postpartum period.  °TREATMENT °The baby blues usually goes away on its own in 1-2 weeks. Social support is often all that is needed. You will be encouraged to get adequate sleep and rest. Occasionally, you may be given medicines to help you sleep.  °Postpartum depression requires treatment because it can last several months or longer if it is not treated. Treatment may include individual or group therapy, medicine, or both to address any social, physiological, and psychological factors  that may play a role in the depression. Regular exercise, a healthy diet, rest, and social support may also be strongly recommended.  °Postpartum psychosis is more serious and needs treatment right away. Hospitalization is often needed. °HOME CARE INSTRUCTIONS °· Get as much rest as you can. Nap when the baby sleeps.   °· Exercise regularly. Some women find yoga and walking to be beneficial.   °· Eat a balanced and nourishing diet.   °· Do little things that you enjoy. Have a cup of tea, take a bubble bath, read your favorite magazine, or listen to your favorite music. °· Avoid alcohol.   °· Ask for help with household chores, cooking, grocery shopping, or running errands as needed. Do not try to do everything.   °· Talk to people close to you about how you are feeling. Get support from your partner, family members, friends, or other new moms. °· Try to stay positive in how you think. Think about the things you are grateful for.   °· Do not spend a lot of time alone.   °· Only take over-the-counter or prescription medicine as directed by your health care provider. °· Keep all your postpartum appointments.   °· Let your health care provider know if you have any concerns.   °SEEK MEDICAL CARE IF: °You are having a reaction to or problems with your medicine. °SEEK IMMEDIATE MEDICAL CARE IF: °· You have suicidal feelings.   °· You think you may harm the baby or someone else. °MAKE SURE YOU: °· Understand these instructions. °· Will watch your condition. °· Will get help right away if you are not doing well or get worse. °  °This information is not intended to replace advice given to you by your health care provider. Make sure you discuss any questions you have with your health care provider. °  °Document Released: 01/27/2004 Document Revised: 04/29/2013 Document Reviewed: 02/03/2013 °Elsevier Interactive Patient Education ©2016 [employer] °Postpartum Care After Cesarean Delivery °After you deliver your newborn  (postpartum period), the usual stay in the hospital is 24-72 hours. If there were problems with your labor or delivery, or if you have other medical problems, you might be in the hospital longer.  °While you are in the hospital, you will receive help and instructions on how to care for yourself and your newborn during the postpartum period.  °While you are in the hospital: °· It is normal for you to have pain or discomfort from the incision in your   abdomen. Be sure to tell your nurses when you are having pain, where the pain is located, and what makes the pain worse. °· If you are breastfeeding, you may feel uncomfortable contractions of your uterus for a couple of weeks. This is normal. The contractions help your uterus get back to normal size. °· It is normal to have some bleeding after delivery. °· For the first 1-3 days after delivery, the flow is red and the amount may be similar to a period. °· It is common for the flow to start and stop. °· In the first few days, you may pass some small clots. Let your nurses know if you begin to pass large clots or your flow increases. °· Do not  flush blood clots down the toilet before having the nurse look at them. °· During the next 3-10 days after delivery, your flow should become more watery and pink or brown-tinged in color. °· Ten to fourteen days after delivery, your flow should be a small amount of yellowish-white discharge. °· The amount of your flow will decrease over the first few weeks after delivery. Your flow may stop in 6-8 weeks. Most women have had their flow stop by 12 weeks after delivery. °· You should change your sanitary pads frequently. °· Wash your hands thoroughly with soap and water for at least 20 seconds after changing pads, using the toilet, or before holding or feeding your newborn. °· Your intravenous (IV) tubing will be removed when you are drinking enough fluids. °· The urine drainage tube (urinary catheter) that was inserted before delivery  may be removed within 6-8 hours after delivery or when feeling returns to your legs. You should feel like you need to empty your bladder within the first 6-8 hours after the catheter has been removed. °· In case you become weak, lightheaded, or faint, call your nurse before you get out of bed for the first time and before you take a shower for the first time. °· Within the first few days after delivery, your breasts may begin to feel tender and full. This is called engorgement. Breast tenderness usually goes away within 48-72 hours after engorgement occurs. You may also notice milk leaking from your breasts. If you are not breastfeeding, do not stimulate your breasts. Breast stimulation can make your breasts produce more milk. °· Spending as much time as possible with your newborn is very important. During this time, you and your newborn can feel close and get to know each other. Having your newborn stay in your room (rooming in) will help to strengthen the bond with your newborn. It will give you time to get to know your newborn and become comfortable caring for your newborn. °· Your hormones change after delivery. Sometimes the hormone changes can temporarily cause you to feel sad or tearful. These feelings should not last more than a few days. If these feelings last longer than that, you should talk to your caregiver. °· If desired, talk to your caregiver about methods of family planning or contraception. °· Talk to your caregiver about immunizations. Your caregiver may want you to have the following immunizations before leaving the hospital: °· Tetanus, diphtheria, and pertussis (Tdap) or tetanus and diphtheria (Td) immunization. It is very important that you and your family (including grandparents) or others caring for your newborn are up-to-date with the Tdap or Td immunizations. The Tdap or Td immunization can help protect your newborn from getting ill. °· Rubella immunization. °· Varicella (chickenpox)    immunization. °· Influenza immunization. You should receive this annual immunization if you did not receive the immunization during your pregnancy. °  °This information is not intended to replace advice given to you by your health care provider. Make sure you discuss any questions you have with your health care provider. °  °Document Released: 01/17/2012 Document Reviewed: 01/17/2012 °[employer]vier Interactive Patient Education ©2016 [employer] °Breastfeeding and Mastitis °Mastitis is inflammation of the breast tissue. It can occur in women who are breastfeeding. This can make breastfeeding painful. Mastitis will sometimes go away on its own. Your health care provider will help determine if treatment is needed. °CAUSES °Mastitis is often associated with a blocked milk (lactiferous) duct. This can happen when too much milk builds up in the breast. Causes of excess milk in the breast can include: °· Poor latch-on. If your baby is not latched onto the breast properly, she or he may not empty your breast completely while breastfeeding. °· Allowing too much time to pass between feedings. °· Wearing a bra or other clothing that is too tight. This puts extra pressure on the lactiferous ducts so milk does not flow through them as it should. °Mastitis can also be caused by a bacterial infection. Bacteria may enter the breast tissue through cuts or openings in the skin. In women who are breastfeeding, this may occur because of cracked or irritated skin. Cracks in the skin are often caused when your baby does not latch on properly to the breast. °SIGNS AND SYMPTOMS °· Swelling, redness, tenderness, and pain in an area of the breast. °· Swelling of the glands under the arm on the same side. °· Fever may or may not accompany mastitis. °If an infection is allowed to progress, a collection of pus (abscess) may develop. °DIAGNOSIS  °Your health care provider can usually diagnose mastitis based on your symptoms and a physical exam.  Tests may be done to help confirm the diagnosis. These may include: °· Removal of pus from the breast by applying pressure to the area. This pus can be examined in the lab to determine which bacteria are present. If an abscess has developed, the fluid in the abscess can be removed with a needle. This can also be used to confirm the diagnosis and determine the bacteria present. In most cases, pus will not be present. °· Blood tests to determine if your body is fighting a bacterial infection. °· Mammogram or ultrasound tests to rule out other problems or diseases. °TREATMENT  °Mastitis that occurs with breastfeeding will sometimes go away on its own. Your health care provider may choose to wait 24 hours after first seeing you to decide whether a prescription medicine is needed. If your symptoms are worse after 24 hours, your health care provider will likely prescribe an antibiotic medicine to treat the mastitis. He or she will determine which bacteria are most likely causing the infection and will then select an appropriate antibiotic medicine. This is sometimes changed based on the results of tests performed to identify the bacteria, or if there is no response to the antibiotic medicine selected. Antibiotic medicines are usually given by mouth. You may also be given medicine for pain. °HOME CARE INSTRUCTIONS °· Only take over-the-counter or prescription medicines for pain, fever, or discomfort as directed by your health care provider. °· If your health care provider prescribed an antibiotic medicine, take the medicine as directed. Make sure you finish it even if you start to feel better. °· Do not wear a   tight or underwire bra. Wear a soft, supportive bra. °· Increase your fluid intake, especially if you have a fever. °· Continue to empty the breast. Your health care provider can tell you whether this milk is safe for your infant or needs to be thrown out. You may be told to stop nursing until your health care  provider thinks it is safe for your baby. Use a breast pump if you are advised to stop nursing. °· Keep your nipples clean and dry. °· Empty the first breast completely before going to the other breast. If your baby is not emptying your breasts completely for some reason, use a breast pump to empty your breasts. °· If you go back to work, pump your breasts while at work to stay in time with your nursing schedule. °· Avoid allowing your breasts to become overly filled with milk (engorged). °SEEK MEDICAL CARE IF: °· You have pus-like discharge from the breast. °· Your symptoms do not improve with the treatment prescribed by your health care provider within 2 days. °SEEK IMMEDIATE MEDICAL CARE IF: °· Your pain and swelling are getting worse. °· You have pain that is not controlled with medicine. °· You have a red line extending from the breast toward your armpit. °· You have a fever or persistent symptoms for more than 2-3 days. °· You have a fever and your symptoms suddenly get worse. °MAKE SURE YOU:  °· Understand these instructions. °· Will watch your condition. °· Will get help right away if you are not doing well or get worse. °  °This information is not intended to replace advice given to you by your health care provider. Make sure you discuss any questions you have with your health care provider. °  °Document Released: 08/19/2004 Document Revised: 04/29/2013 Document Reviewed: 11/28/2012 °[employer]vier Interactive Patient Education ©2016 [employer] °Breastfeeding °Deciding to breastfeed is one of the best choices you can make for you and your baby. A change in hormones during pregnancy causes your breast tissue to grow and increases the number and size of your milk ducts. These hormones also allow proteins, sugars, and fats from your blood supply to make breast milk in your milk-producing glands. Hormones prevent breast milk from being released before your baby is born as well as prompt milk flow after birth. Once  breastfeeding has begun, thoughts of your baby, as well as his or her sucking or crying, can stimulate the release of milk from your milk-producing glands.  °BENEFITS OF BREASTFEEDING °For Your Baby °· Your first milk (colostrum) helps your baby's digestive system function better. °· There are antibodies in your milk that help your baby fight off infections. °· Your baby has a lower incidence of asthma, allergies, and sudden infant death syndrome. °· The nutrients in breast milk are better for your baby than infant formulas and are designed uniquely for your baby's needs. °· Breast milk improves your baby's brain development. °· Your baby is less likely to develop other conditions, such as childhood obesity, asthma, or type 2 diabetes mellitus. °For You °· Breastfeeding helps to create a very special bond between you and your baby. °· Breastfeeding is convenient. Breast milk is always available at the correct temperature and costs nothing. °· Breastfeeding helps to burn calories and helps you lose the weight gained during pregnancy. °· Breastfeeding makes your uterus contract to its prepregnancy size faster and slows bleeding (lochia) after you give birth.   °· Breastfeeding helps to lower your risk of developing type   2 diabetes mellitus, osteoporosis, and breast or ovarian cancer later in life. °SIGNS THAT YOUR BABY IS HUNGRY °Early Signs of Hunger °· Increased alertness or activity. °· Stretching. °· Movement of the head from side to side. °· Movement of the head and opening of the mouth when the corner of the mouth or cheek is stroked (rooting). °· Increased sucking sounds, smacking lips, cooing, sighing, or squeaking. °· Hand-to-mouth movements. °· Increased sucking of fingers or hands. °Late Signs of Hunger °· Fussing. °· Intermittent crying. °Extreme Signs of Hunger °Signs of extreme hunger will require calming and consoling before your baby will be able to breastfeed successfully. Do not wait for the  following signs of extreme hunger to occur before you initiate breastfeeding: °· Restlessness. °· A loud, strong cry. °· Screaming. °BREASTFEEDING BASICS °Breastfeeding Initiation °· Find a comfortable place to sit or lie down, with your neck and back well supported. °· Place a pillow or rolled up blanket under your baby to bring him or her to the level of your breast (if you are seated). Nursing pillows are specially designed to help support your arms and your baby while you breastfeed. °· Make sure that your baby's abdomen is facing your abdomen. °· Gently massage your breast. With your fingertips, massage from your chest wall toward your nipple in a circular motion. This encourages milk flow. You may need to continue this action during the feeding if your milk flows slowly. °· Support your breast with 4 fingers underneath and your thumb above your nipple. Make sure your fingers are well away from your nipple and your baby's mouth. °· Stroke your baby's lips gently with your finger or nipple. °· When your baby's mouth is open wide enough, quickly bring your baby to your breast, placing your entire nipple and as much of the colored area around your nipple (areola) as possible into your baby's mouth. °· More areola should be visible above your baby's upper lip than below the lower lip. °· Your baby's tongue should be between his or her lower gum and your breast. °· Ensure that your baby's mouth is correctly positioned around your nipple (latched). Your baby's lips should create a seal on your breast and be turned out (everted). °· It is common for your baby to suck about 2-3 minutes in order to start the flow of breast milk. °Latching °Teaching your baby how to latch on to your breast properly is very important. An improper latch can cause nipple pain and decreased milk supply for you and poor weight gain in your baby. Also, if your baby is not latched onto your nipple properly, he or she may swallow some air during  feeding. This can make your baby fussy. Burping your baby when you switch breasts during the feeding can help to get rid of the air. However, teaching your baby to latch on properly is still the best way to prevent fussiness from swallowing air while breastfeeding. °Signs that your baby has successfully latched on to your nipple: °· Silent tugging or silent sucking, without causing you pain. °· Swallowing heard between every 3-4 sucks. °· Muscle movement above and in front of his or her ears while sucking. °Signs that your baby has not successfully latched on to nipple: °· Sucking sounds or smacking sounds from your baby while breastfeeding. °· Nipple pain. °If you think your baby has not latched on correctly, slip your finger into the corner of your baby's mouth to break the suction and place it   between your baby's gums. Attempt breastfeeding initiation again. °Signs of Successful Breastfeeding °Signs from your baby: °· A gradual decrease in the number of sucks or complete cessation of sucking. °· Falling asleep. °· Relaxation of his or her body. °· Retention of a small amount of milk in his or her mouth. °· Letting go of your breast by himself or herself. °Signs from you: °· Breasts that have increased in firmness, weight, and size 1-3 hours after feeding. °· Breasts that are softer immediately after breastfeeding. °· Increased milk volume, as well as a change in milk consistency and color by the fifth day of breastfeeding. °· Nipples that are not sore, cracked, or bleeding. °Signs That Your Baby is Getting Enough Milk °· Wetting at least 3 diapers in a 24-hour period. The urine should be clear and pale yellow by age 5 days. °· At least 3 stools in a 24-hour period by age 5 days. The stool should be soft and yellow. °· At least 3 stools in a 24-hour period by age 7 days. The stool should be seedy and yellow. °· No loss of weight greater than 10% of birth weight during the first 3 days of age. °· Average weight  gain of 4-7 ounces (113-198 g) per week after age 4 days. °· Consistent daily weight gain by age 5 days, without weight loss after the age of 2 weeks. °After a feeding, your baby may spit up a small amount. This is common. °BREASTFEEDING FREQUENCY AND DURATION °Frequent feeding will help you make more milk and can prevent sore nipples and breast engorgement. Breastfeed when you feel the need to reduce the fullness of your breasts or when your baby shows signs of hunger. This is called "breastfeeding on demand." Avoid introducing a pacifier to your baby while you are working to establish breastfeeding (the first 4-6 weeks after your baby is born). After this time you may choose to use a pacifier. Research has shown that pacifier use during the first year of a baby's life decreases the risk of sudden infant death syndrome (SIDS). °Allow your baby to feed on each breast as long as he or she wants. Breastfeed until your baby is finished feeding. When your baby unlatches or falls asleep while feeding from the first breast, offer the second breast. Because newborns are often sleepy in the first few weeks of life, you may need to awaken your baby to get him or her to feed. °Breastfeeding times will vary from baby to baby. However, the following rules can serve as a guide to help you ensure that your baby is properly fed: °· Newborns (babies 4 weeks of age or younger) may breastfeed every 1-3 hours. °· Newborns should not go longer than 3 hours during the day or 5 hours during the night without breastfeeding. °· You should breastfeed your baby a minimum of 8 times in a 24-hour period until you begin to introduce solid foods to your baby at around 6 months of age. °BREAST MILK PUMPING °Pumping and storing breast milk allows you to ensure that your baby is exclusively fed your breast milk, even at times when you are unable to breastfeed. This is especially important if you are going back to work while you are still  breastfeeding or when you are not able to be present during feedings. Your lactation consultant can give you guidelines on how long it is safe to store breast milk. °A breast pump is a machine that allows you to pump milk   from your breast into a sterile bottle. The pumped breast milk can then be stored in a refrigerator or freezer. Some breast pumps are operated by hand, while others use electricity. Ask your lactation consultant which type will work best for you. Breast pumps can be purchased, but some hospitals and breastfeeding support groups lease breast pumps on a monthly basis. A lactation consultant can teach you how to hand express breast milk, if you prefer not to use a pump. °CARING FOR YOUR BREASTS WHILE YOU BREASTFEED °Nipples can become dry, cracked, and sore while breastfeeding. The following recommendations can help keep your breasts moisturized and healthy: °· Avoid using soap on your nipples. °· Wear a supportive bra. Although not required, special nursing bras and tank tops are designed to allow access to your breasts for breastfeeding without taking off your entire bra or top. Avoid wearing underwire-style bras or extremely tight bras. °· Air dry your nipples for 3-4 minutes after each feeding. °· Use only cotton bra pads to absorb leaked breast milk. Leaking of breast milk between feedings is normal. °· Use lanolin on your nipples after breastfeeding. Lanolin helps to maintain your skin's normal moisture barrier. If you use pure lanolin, you do not need to wash it off before feeding your baby again. Pure lanolin is not toxic to your baby. You may also hand express a few drops of breast milk and gently massage that milk into your nipples and allow the milk to air dry. °In the first few weeks after giving birth, some women experience extremely full breasts (engorgement). Engorgement can make your breasts feel heavy, warm, and tender to the touch. Engorgement peaks within 3-5 days after you give  birth. The following recommendations can help ease engorgement: °· Completely empty your breasts while breastfeeding or pumping. You may want to start by applying warm, moist heat (in the shower or with warm water-soaked hand towels) just before feeding or pumping. This increases circulation and helps the milk flow. If your baby does not completely empty your breasts while breastfeeding, pump any extra milk after he or she is finished. °· Wear a snug bra (nursing or regular) or tank top for 1-2 days to signal your body to slightly decrease milk production. °· Apply ice packs to your breasts, unless this is too uncomfortable for you. °· Make sure that your baby is latched on and positioned properly while breastfeeding. °If engorgement persists after 48 hours of following these recommendations, contact your health care provider or a lactation consultant. °OVERALL HEALTH CARE RECOMMENDATIONS WHILE BREASTFEEDING °· Eat healthy foods. Alternate between meals and snacks, eating 3 of each per day. Because what you eat affects your breast milk, some of the foods may make your baby more irritable than usual. Avoid eating these foods if you are sure that they are negatively affecting your baby. °· Drink milk, fruit juice, and water to satisfy your thirst (about 10 glasses a day). °· Rest often, relax, and continue to take your prenatal vitamins to prevent fatigue, stress, and anemia. °· Continue breast self-awareness checks. °· Avoid chewing and smoking tobacco. Chemicals from cigarettes that pass into breast milk and exposure to secondhand smoke may harm your baby. °· Avoid alcohol and drug use, including marijuana. °Some medicines that may be harmful to your baby can pass through breast milk. It is important to ask your health care provider before taking any medicine, including all over-the-counter and prescription medicine as well as vitamin and herbal supplements. °It is possible to become   pregnant while breastfeeding. If  birth control is desired, ask your health care provider about options that will be safe for your baby. °SEEK MEDICAL CARE IF: °· You feel like you want to stop breastfeeding or have become frustrated with breastfeeding. °· You have painful breasts or nipples. °· Your nipples are cracked or bleeding. °· Your breasts are red, tender, or warm. °· You have a swollen area on either breast. °· You have a fever or chills. °· You have nausea or vomiting. °· You have drainage other than breast milk from your nipples. °· Your breasts do not become full before feedings by the fifth day after you give birth. °· You feel sad and depressed. °· Your baby is too sleepy to eat well. °· Your baby is having trouble sleeping.   °· Your baby is wetting less than 3 diapers in a 24-hour period. °· Your baby has less than 3 stools in a 24-hour period. °· Your baby's skin or the white part of his or her eyes becomes yellow.   °· Your baby is not gaining weight by 5 days of age. °SEEK IMMEDIATE MEDICAL CARE IF: °· Your baby is overly tired (lethargic) and does not want to wake up and feed. °· Your baby develops an unexplained fever. °  °This information is not intended to replace advice given to you by your health care provider. Make sure you discuss any questions you have with your health care provider. °  °Document Released: 04/24/2005 Document Revised: 01/13/2015 Document Reviewed: 10/16/2012 °Elsevier Interactive Patient Education ©2016 [employer] ° °

## 2016-01-31 NOTE — Progress Notes (Signed)
Patient ID: [name], female   DOB: 1980/09/11, 35 y.o.   MRN: [id number] Subjective: S/P Repeat Cesarean Delivery d/t PROM / Failed VBAC / Presumed Macrosomia POD# 3 Information for the patient's newborn:  [name], Girl Melanie OrleansMelanie [[id number]][[id number]]  female   Reports feeling well. Ready for discharge home. Feeding: breast Patient reports tolerating PO.  Breast symptoms: good latch and milk flow. Pain controlled with ibuprofen (OTC) and narcotic analgesics including Percocet Denies HA/SOB/C/P/N/V/dizziness. Flatus present. (+) BM. She reports vaginal bleeding as normal, without clots.  She is ambulating, urinating without difficult.     Objective:   VS:  Vitals:   01/30/16 0631 01/30/16 1044 01/30/16 1900 01/31/16 0535  BP: (!) 116/58  (!) 106/52 119/67  Pulse: 68  70 78  Resp: 19 19 20 18   Temp: 98.5 F (36.9 C)  98.1 F (36.7 C) 98.2 F (36.8 C)  TempSrc:      SpO2:  98% 98%   Weight:      Height:         Intake/Output Summary (Last 24 hours) at 01/31/16 1015 Last data filed at 01/30/16 1500  Gross per 24 hour  Intake                0 ml  Output              500 ml  Net             -500 ml        Recent Labs  01/29/16 0515  WBC 15.2*  HGB 11.7*  HCT 34.2*  PLT 196     Blood type: --/--/O POS (09/22 0755)  Rubella: Immune (02/27 0000)     Physical Exam:   General: alert, cooperative, fatigued, no distress and mildly obese  Abdomen: soft, nontender, normal bowel sounds  Incision: clean, dry, intact and skin well-approximated with sutures  Uterine Fundus: firm, 2 FB below umbilicus, nontender  Lochia: minimal  Ext: extremities normal, atraumatic, no cyanosis or edema and Homans sign is negative, no sign of DVT   Assessment/Plan: 35 y.o.   POD# 3.  S/P Cesarean Delivery.  Indications: Repeat / PROM / Failed VBAC / Presumed Macrosomia                Principal Problem:   Postpartum care following cesarean delivery (9/22)  Doing well, stable.         Regular diet as tolerated Ambulate Routine post-op care D/C home today F/U with Marlinda Mikeanya Bailey, CNM in 2 wks for PPD evaluation F/U with Marlinda Mikeanya Bailey, CNM in 6 wks for PP visit  Kenard GowerAWSON, Dell Briner, M, MSN, CNM 01/31/2016, 10:15 AM

## 2016-03-27 DIAGNOSIS — M6281 Muscle weakness (generalized): Secondary | ICD-10-CM | POA: Diagnosis not present

## 2016-03-27 DIAGNOSIS — M545 Low back pain: Secondary | ICD-10-CM | POA: Diagnosis not present

## 2016-03-27 DIAGNOSIS — R278 Other lack of coordination: Secondary | ICD-10-CM | POA: Diagnosis not present

## 2016-04-11 DIAGNOSIS — R102 Pelvic and perineal pain: Secondary | ICD-10-CM | POA: Diagnosis not present

## 2016-04-11 DIAGNOSIS — M6281 Muscle weakness (generalized): Secondary | ICD-10-CM | POA: Diagnosis not present

## 2016-04-11 DIAGNOSIS — M62838 Other muscle spasm: Secondary | ICD-10-CM | POA: Diagnosis not present

## 2016-04-11 DIAGNOSIS — M545 Low back pain: Secondary | ICD-10-CM | POA: Diagnosis not present

## 2016-04-24 DIAGNOSIS — F411 Generalized anxiety disorder: Secondary | ICD-10-CM | POA: Diagnosis not present

## 2016-04-27 DIAGNOSIS — M545 Low back pain: Secondary | ICD-10-CM | POA: Diagnosis not present

## 2016-04-27 DIAGNOSIS — R102 Pelvic and perineal pain: Secondary | ICD-10-CM | POA: Diagnosis not present

## 2016-04-27 DIAGNOSIS — M62838 Other muscle spasm: Secondary | ICD-10-CM | POA: Diagnosis not present

## 2016-04-27 DIAGNOSIS — M6281 Muscle weakness (generalized): Secondary | ICD-10-CM | POA: Diagnosis not present

## 2016-05-03 DIAGNOSIS — F411 Generalized anxiety disorder: Secondary | ICD-10-CM | POA: Diagnosis not present

## 2016-06-03 DIAGNOSIS — F411 Generalized anxiety disorder: Secondary | ICD-10-CM | POA: Diagnosis not present

## 2016-07-08 DIAGNOSIS — F411 Generalized anxiety disorder: Secondary | ICD-10-CM | POA: Diagnosis not present

## 2016-07-15 DIAGNOSIS — F411 Generalized anxiety disorder: Secondary | ICD-10-CM | POA: Diagnosis not present

## 2016-07-26 DIAGNOSIS — J020 Streptococcal pharyngitis: Secondary | ICD-10-CM | POA: Diagnosis not present

## 2016-07-26 DIAGNOSIS — J028 Acute pharyngitis due to other specified organisms: Secondary | ICD-10-CM | POA: Diagnosis not present

## 2016-07-26 DIAGNOSIS — R509 Fever, unspecified: Secondary | ICD-10-CM | POA: Diagnosis not present

## 2016-08-05 DIAGNOSIS — F411 Generalized anxiety disorder: Secondary | ICD-10-CM | POA: Diagnosis not present

## 2016-08-12 DIAGNOSIS — F411 Generalized anxiety disorder: Secondary | ICD-10-CM | POA: Diagnosis not present

## 2016-08-16 DIAGNOSIS — Z6829 Body mass index (BMI) 29.0-29.9, adult: Secondary | ICD-10-CM | POA: Diagnosis not present

## 2016-08-16 DIAGNOSIS — R799 Abnormal finding of blood chemistry, unspecified: Secondary | ICD-10-CM | POA: Diagnosis not present

## 2016-08-16 DIAGNOSIS — Z139 Encounter for screening, unspecified: Secondary | ICD-10-CM | POA: Diagnosis not present

## 2016-08-26 DIAGNOSIS — F411 Generalized anxiety disorder: Secondary | ICD-10-CM | POA: Diagnosis not present

## 2016-09-09 DIAGNOSIS — F411 Generalized anxiety disorder: Secondary | ICD-10-CM | POA: Diagnosis not present

## 2016-10-07 DIAGNOSIS — F411 Generalized anxiety disorder: Secondary | ICD-10-CM | POA: Diagnosis not present

## 2016-11-04 DIAGNOSIS — F411 Generalized anxiety disorder: Secondary | ICD-10-CM | POA: Diagnosis not present

## 2016-11-21 DIAGNOSIS — Z0000 Encounter for general adult medical examination without abnormal findings: Secondary | ICD-10-CM | POA: Diagnosis not present

## 2016-11-21 DIAGNOSIS — Z13220 Encounter for screening for lipoid disorders: Secondary | ICD-10-CM | POA: Diagnosis not present

## 2016-11-23 DIAGNOSIS — Z0000 Encounter for general adult medical examination without abnormal findings: Secondary | ICD-10-CM | POA: Diagnosis not present

## 2016-11-23 DIAGNOSIS — Z6829 Body mass index (BMI) 29.0-29.9, adult: Secondary | ICD-10-CM | POA: Diagnosis not present

## 2017-01-29 ENCOUNTER — Encounter (HOSPITAL_COMMUNITY): Payer: Self-pay

## 2017-01-29 ENCOUNTER — Emergency Department (HOSPITAL_COMMUNITY): Payer: BLUE CROSS/BLUE SHIELD

## 2017-01-29 ENCOUNTER — Emergency Department (HOSPITAL_COMMUNITY)
Admission: EM | Admit: 2017-01-29 | Discharge: 2017-01-29 | Disposition: A | Discharge status: Home / Self Care | Payer: BLUE CROSS/BLUE SHIELD | Attending: Emergency Medicine | Admitting: Emergency Medicine

## 2017-01-29 DIAGNOSIS — I880 Nonspecific mesenteric lymphadenitis: Secondary | ICD-10-CM | POA: Diagnosis not present

## 2017-01-29 DIAGNOSIS — Z6829 Body mass index (BMI) 29.0-29.9, adult: Secondary | ICD-10-CM | POA: Diagnosis not present

## 2017-01-29 DIAGNOSIS — J45909 Unspecified asthma, uncomplicated: Secondary | ICD-10-CM | POA: Diagnosis not present

## 2017-01-29 DIAGNOSIS — Z79899 Other long term (current) drug therapy: Secondary | ICD-10-CM | POA: Insufficient documentation

## 2017-01-29 DIAGNOSIS — R109 Unspecified abdominal pain: Secondary | ICD-10-CM | POA: Diagnosis not present

## 2017-01-29 DIAGNOSIS — R1031 Right lower quadrant pain: Secondary | ICD-10-CM | POA: Diagnosis not present

## 2017-01-29 LAB — CBC
HCT: 37.9 % (ref 36.0–46.0)
Hemoglobin: 12.7 g/dL (ref 12.0–15.0)
MCH: 28.5 pg (ref 26.0–34.0)
MCHC: 33.5 g/dL (ref 30.0–36.0)
MCV: 85.2 fL (ref 78.0–100.0)
Platelets: 237 10*3/uL (ref 150–400)
RBC: 4.45 MIL/uL (ref 3.87–5.11)
RDW: 13.1 % (ref 11.5–15.5)
WBC: 6.3 10*3/uL (ref 4.0–10.5)

## 2017-01-29 LAB — COMPREHENSIVE METABOLIC PANEL
ALT: 10 U/L — ABNORMAL LOW (ref 14–54)
AST: 14 U/L — ABNORMAL LOW (ref 15–41)
Albumin: 4.2 g/dL (ref 3.5–5.0)
Alkaline Phosphatase: 66 U/L (ref 38–126)
Anion gap: 8 (ref 5–15)
BUN: <5 mg/dL — ABNORMAL LOW (ref 6–20)
CO2: 26 mmol/L (ref 22–32)
Calcium: 9.1 mg/dL (ref 8.9–10.3)
Chloride: 106 mmol/L (ref 101–111)
Creatinine, Ser: 0.61 mg/dL (ref 0.44–1.00)
GFR calc Af Amer: >60 mL/min (ref >60)
GFR calc non Af Amer: >60 mL/min (ref >60)
Glucose, Bld: 88 mg/dL (ref 65–99)
Potassium: 4.1 mmol/L (ref 3.5–5.1)
Sodium: 140 mmol/L (ref 135–145)
Total Bilirubin: 0.6 mg/dL (ref 0.3–1.2)
Total Protein: 7.5 g/dL (ref 6.5–8.1)

## 2017-01-29 LAB — URINALYSIS, ROUTINE W REFLEX MICROSCOPIC
Bilirubin Urine: NEGATIVE
Glucose, UA: NEGATIVE mg/dL
Ketones, ur: NEGATIVE mg/dL
Leukocytes, UA: NEGATIVE
Nitrite: NEGATIVE
Protein, ur: NEGATIVE mg/dL
Specific Gravity, Urine: 1.01 (ref 1.005–1.030)
pH: 6 (ref 5.0–8.0)

## 2017-01-29 LAB — URINALYSIS, MICROSCOPIC (REFLEX)

## 2017-01-29 LAB — PREGNANCY, URINE: Preg Test, Ur: NEGATIVE

## 2017-01-29 LAB — LIPASE, BLOOD: Lipase: 20 U/L (ref 11–51)

## 2017-01-29 MED ORDER — SODIUM CHLORIDE 0.9 % IV BOLUS (SEPSIS)
1000.0000 mL | Freq: Once | INTRAVENOUS | Status: AC
  Administered 2017-01-29: 1000 mL via INTRAVENOUS

## 2017-01-29 MED ORDER — IOPAMIDOL (ISOVUE-300) INJECTION 61%
100.0000 mL | Freq: Once | INTRAVENOUS | Status: AC | PRN
  Administered 2017-01-29: 100 mL via INTRAVENOUS

## 2017-01-29 NOTE — Discharge Instructions (Signed)
Please follow up with your doctor.  Your workup today was reassuring. CT scan showed that you have enlarged lymph nodes mostly in your right lower abdominal quadrant. This is consistent with a condition called mesenteric adenitis.  Here CT scan did not show any evidence of masses, kidney stones, or any other concerning finding.   Please make sure that you stay well-hydrated.  Your blood work shows that your kidneys and liver are working like they are supposed to.  You are not anemic.  Your urine had a slight amount of blood in it and has been sent for culture.  Your blood does not show any major signs of infection. Your blood sugar was normal.

## 2017-01-29 NOTE — ED Triage Notes (Signed)
Patient c/o lower back pain x 1 week. Patient c/o right lower abdominal pain and nausea x 4 days, but has now starting hurting bilateral lower abdominal pain.

## 2017-01-29 NOTE — ED Provider Notes (Signed)
[facility]-EMERGENCY DEPT Provider Note   CSN: [id number] Arrival date & time: 01/29/17  0941     History   Chief Complaint Chief Complaint  Patient presents with  . Abdominal Pain  . Back Pain    HPI [name] is a 36 y.o. female with a history of cholecystectomy and C-section 3 who presents today for right lower quadrant pain since Thursday. She reports that initially it started in her right lower quadrant and has gradually spread throughout her entire abdomen. She reports that her appetite has been decreased, which she says is abnormal for her and she is breast-feeding and usually eats "a lot."  She reports that she has been nauseous, has not vomited. She reports that prior to her abdominal pain she felt like she was constipated, however since her pain started she has been having loose stools.  Denies any fevers or chills.  She states that she went to her primary care doctor's office this morning, and they sent her here due to concern for appendicitis.  She denies any vaginal bleeding, or discharge.    HPI  Past Medical History:  Diagnosis Date  . Allergic rhinitis   . Asthma   . Cough     Patient Active Problem List   Diagnosis Date Noted  . Postpartum care following cesarean delivery (9/22) 01/29/2016  . ALLERGIC RHINITIS 07/27/2009  . 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 CESAREAN SECTION N/A 05/12/2014   Procedure: CESAREAN SECTION;  Surgeon: Zelphia Cairo, MD;  Location: [facility] ORS;  Service: Obstetrics;  Laterality: N/A;  . CESAREAN SECTION N/A 01/28/2016   Procedure: CESAREAN SECTION;  Surgeon: Noland Fordyce, MD;  Location: [facility] BIRTHING SU[facility];  Service: Obstetrics;  Laterality: N/A;  . CESAREAN SECTION CLASSICAL  2012  . CHOLECYSTECTOMY  2010  . DILATION AND EVACUATION N/A 05/16/2013   Procedure: DILATATION AND EVACUATION;  Surgeon: Zelphia Cairo, MD;  Location: [facility] ORS;  Service: Gynecology;  Laterality: N/A;  .  WISDOM TOOTH EXTRACTION  1997    OB History    Gravida Para Term Preterm AB Living   SAB TAB Ectopic Multiple Live Births   1     0 2       Home Medications    Prior to Admission medications   Medication Sig Start Date End Date Taking? Authorizing Provider  albuterol (PROVENTIL HFA;VENTOLIN HFA) 108 (90 BASE) MCG/ACT inhaler Inhale 2 puffs into the lungs every 6 (six) hours as needed for wheezing or shortness of breath (rescue). 03/11/15   Lupita Leash, MD  ibuprofen (ADVIL,MOTRIN) 600 MG tablet Take 1 tablet (600 mg total) by mouth every 6 (six) hours. 01/31/16   Raelyn Mora, CNM  oxyCODONE-acetaminophen (PERCOCET/ROXICET) 5-325 MG tablet Take 1-2 tablets by mouth every 4 (four) hours as needed for moderate pain. 01/31/16   Raelyn Mora, CNM  QVAR 80 MCG/ACT inhaler USE 2 INHALATIONS TWICE A DAY Patient not taking: Reported on 01/28/2016 06/28/15   Lupita Leash, MD    Family History Family History  Problem Relation Age of Onset  . Hypertension Maternal Aunt     Social History Social History  Substance Use Topics  . Smoking status: Never Smoker  . Smokeless tobacco: Never Used  . Alcohol use No     Allergies   Morphine and related   Review of Systems Review of Systems  Constitutional: Positive for appetite change. Negative for  chills, fever and unexpected weight change.  HENT: Negative for ear pain and sore throat.   Eyes: Negative for pain and visual disturbance.  Respiratory: Negative for cough and shortness of breath.   Cardiovascular: Negative for chest pain and palpitations.  Gastrointestinal: Positive for abdominal pain, diarrhea and nausea. Negative for vomiting.  Genitourinary: Negative for dysuria, flank pain, frequency, hematuria, urgency, vaginal bleeding, vaginal discharge and vaginal pain.  Musculoskeletal: Negative for arthralgias and back pain.  Skin: Negative for color change and rash.  Neurological: Negative for  seizures and syncope.  All other systems reviewed and are negative.    Physical Exam Updated Vital Signs BP 120/74   Pulse 61   Temp 98.4 F (36.9 C) (Oral)   Resp 16   Ht 6' (1.829 m)   Wt 100.2 kg (221 lb)   LMP 01/15/2017 (Approximate)   SpO2 97%   BMI 29.97 kg/m   Physical Exam  Constitutional: She appears well-developed and well-nourished. No distress.  HENT:  Head: Normocephalic and atraumatic.  Mouth/Throat: Oropharynx is clear and moist.  Eyes: Conjunctivae are normal. Right eye exhibits no discharge. Left eye exhibits no discharge. No scleral icterus.  Neck: Normal range of motion.  Cardiovascular: Normal rate, regular rhythm and intact distal pulses.  Exam reveals no friction rub.   No murmur heard. Pulmonary/Chest: Effort normal and breath sounds normal. No stridor. No respiratory distress. She has no wheezes. She has no rales.  Abdominal: Soft. Normal appearance and bowel sounds are normal. She exhibits no distension. There is no hepatosplenomegaly. There is tenderness in the right lower quadrant. There is rebound and tenderness at McBurney's point. There is no rigidity, no guarding and negative Murphy's sign.  Tenderness in the right lower quadrant, diffuse tenderness throughout entire abdomen. Positive rebound tenderness and right lower quadrant. Palpation of left lower quadrant causes increased pain in right lower quadrant.  Musculoskeletal: She exhibits no edema or deformity.  Neurological: She is alert. She exhibits normal muscle tone.  Skin: Skin is warm and dry. She is not diaphoretic.  Psychiatric: She has a normal mood and affect. Her behavior is normal.  Nursing note and vitals reviewed.    ED Treatments / Results  Labs (all labs ordered are listed, but only abnormal results are displayed) Labs Reviewed  COMPREHENSIVE METABOLIC PANEL - Abnormal; Notable for the following:       Result Value   BUN <5 (*)    AST 14 (*)    ALT 10 (*)    All other  components within normal limits  URINALYSIS, ROUTINE W REFLEX MICROSCOPIC - Abnormal; Notable for the following:    APPearance CLOUDY (*)    Hgb urine dipstick TRACE (*)    All other components within normal limits  URINALYSIS, MICROSCOPIC (REFLEX) - Abnormal; Notable for the following:    Bacteria, UA MANY (*)    Squamous Epithelial / LPF TOO NUMEROUS TO COUNT (*)    All other components within normal limits  URINE CULTURE  LIPASE, BLOOD  CBC  PREGNANCY, URINE    EKG  EKG Interpretation None       Radiology Ct Abdomen Pelvis W Contrast  Result Date: 01/29/2017 CLINICAL DATA:  Abdominal pain, appendicitis suspected. Low back pain for 1 week. Right lower abdominal pain and nausea for 4 days. EXAM: CT ABDOMEN AND PELVIS WITH CONTRAST TECHNIQUE: Multidetector CT imaging of the abdomen and pelvis was performed using the standard protocol following bolus administration of intravenous contrast. CONTRAST:  ISOVUE-300  IOPAMIDOL (ISOVUE-300) INJECTION 61% COMPARISON:  None. FINDINGS: Lower chest: No acute abnormality. Hepatobiliary: No focal liver abnormality is seen. Status post cholecystectomy. No biliary dilatation. Pancreas: Unremarkable. No pancreatic ductal dilatation or surrounding inflammatory changes. Spleen: Normal in size without focal abnormality. Adrenals/Urinary Tract: Adrenal glands appear normal. Kidneys appear normal without mass, stone or hydronephrosis. No ureteral or bladder calculi identified. Stomach/Bowel: Bowel is normal in caliber. No bowel wall thickening or evidence of bowel wall inflammation seen. Appendix appears normal. Stomach appears normal, partially decompressed. Vascular/Lymphatic: No vascular abnormality appreciated. Clustered small and mildly prominent lymph nodes in the right lower quadrant mesenteric. Additional small lymph nodes scattered within the central abdominal mesentery and retroperitoneum. Reproductive: Uterus and bilateral adnexa are  unremarkable. Other: Small amount of free fluid in the lower pelvis is likely physiologic in nature. No abscess collection. No free intraperitoneal air. Musculoskeletal: No acute or suspicious osseous finding. Superficial soft tissues are unremarkable. IMPRESSION: 1. Clustered small and mildly prominent lymph nodes within the right lower quadrant mesentery, with additional small lymph nodes scattered within the central abdominal mesentery, raising the possibility of mesenteric adenitis. 2. Remainder of the abdomen and pelvis CT is unremarkable, as detailed above. No bowel obstruction or evidence of bowel wall inflammation. No renal or ureteral calculi. Appendix is normal. Electronically Signed   By: Bary Richard M.D.   On: 01/29/2017 19:48    Procedures Procedures (including critical care time)  Medications Ordered in ED Medications  sodium chloride 0.9 % bolus 1,000 mL (1,000 mLs Intravenous New Bag/Given 01/29/17 1852)  iopamidol (ISOVUE-300) 61 % injection 100 mL (100 mLs Intravenous Contrast Given 01/29/17 1923)     Initial Impression / Assessment and Plan / ED Course  I have reviewed the triage vital signs and the nursing notes.  Pertinent labs & imaging results that were available during my care of the patient were reviewed by me and considered in my medical decision making (see chart for details).    Patient is nontoxic, nonseptic appearing, in no apparent distress.  Patient refused pain medicine.  Fluid bolus given.  Labs, imaging and vitals reviewed.  Patient does not meet the SIRS or Sepsis criteria.  On repeat exam patient does not have a surgical abdomen.  No indication of appendicitis, bowel obstruction, bowel perforation, cholecystitis, diverticulitis, PID or ectopic pregnancy.  Patient discharged home with symptomatic treatment and given strict instructions for follow-up with their primary care physician.  CT scan showed enlarged lymph nodes consistent with mesenteric adenitis. I  have also discussed reasons to return immediately to the ER.  Patient expresses understanding and agrees with plan.   Final Clinical Impressions(s) / ED Diagnoses   Final diagnoses:  Acute mesenteric adenitis    New Prescriptions New Prescriptions   No medications on file     Norman Clay 01/29/17 2041    Jacalyn Lefevre, MD 01/29/17 2229

## 2017-01-31 LAB — URINE CULTURE

## 2017-03-15 DIAGNOSIS — D2371 Other benign neoplasm of skin of right lower limb, including hip: Secondary | ICD-10-CM | POA: Diagnosis not present

## 2017-03-15 DIAGNOSIS — D225 Melanocytic nevi of trunk: Secondary | ICD-10-CM | POA: Diagnosis not present

## 2017-03-15 DIAGNOSIS — D2271 Melanocytic nevi of right lower limb, including hip: Secondary | ICD-10-CM | POA: Diagnosis not present

## 2017-03-15 DIAGNOSIS — L570 Actinic keratosis: Secondary | ICD-10-CM | POA: Diagnosis not present

## 2017-03-15 DIAGNOSIS — D2272 Melanocytic nevi of left lower limb, including hip: Secondary | ICD-10-CM | POA: Diagnosis not present

## 2017-07-19 DIAGNOSIS — Z8639 Personal history of other endocrine, nutritional and metabolic disease: Secondary | ICD-10-CM | POA: Diagnosis not present

## 2017-07-19 DIAGNOSIS — R454 Irritability and anger: Secondary | ICD-10-CM | POA: Diagnosis not present

## 2017-07-19 DIAGNOSIS — J020 Streptococcal pharyngitis: Secondary | ICD-10-CM | POA: Diagnosis not present

## 2017-07-19 DIAGNOSIS — R6883 Chills (without fever): Secondary | ICD-10-CM | POA: Diagnosis not present

## 2017-07-19 DIAGNOSIS — R5382 Chronic fatigue, unspecified: Secondary | ICD-10-CM | POA: Diagnosis not present

## 2017-08-29 IMAGING — CR DG KNEE COMPLETE 4+V*L*
3 series · 3 of 3 positions shown · non-contrast
Comparison: None.

CLINICAL DATA: Knee pain.  No known injury.  Initial evaluation.

EXAM:
LEFT KNEE - COMPLETE 4+ VIEW

[w knee obl. left (1 of 2)]
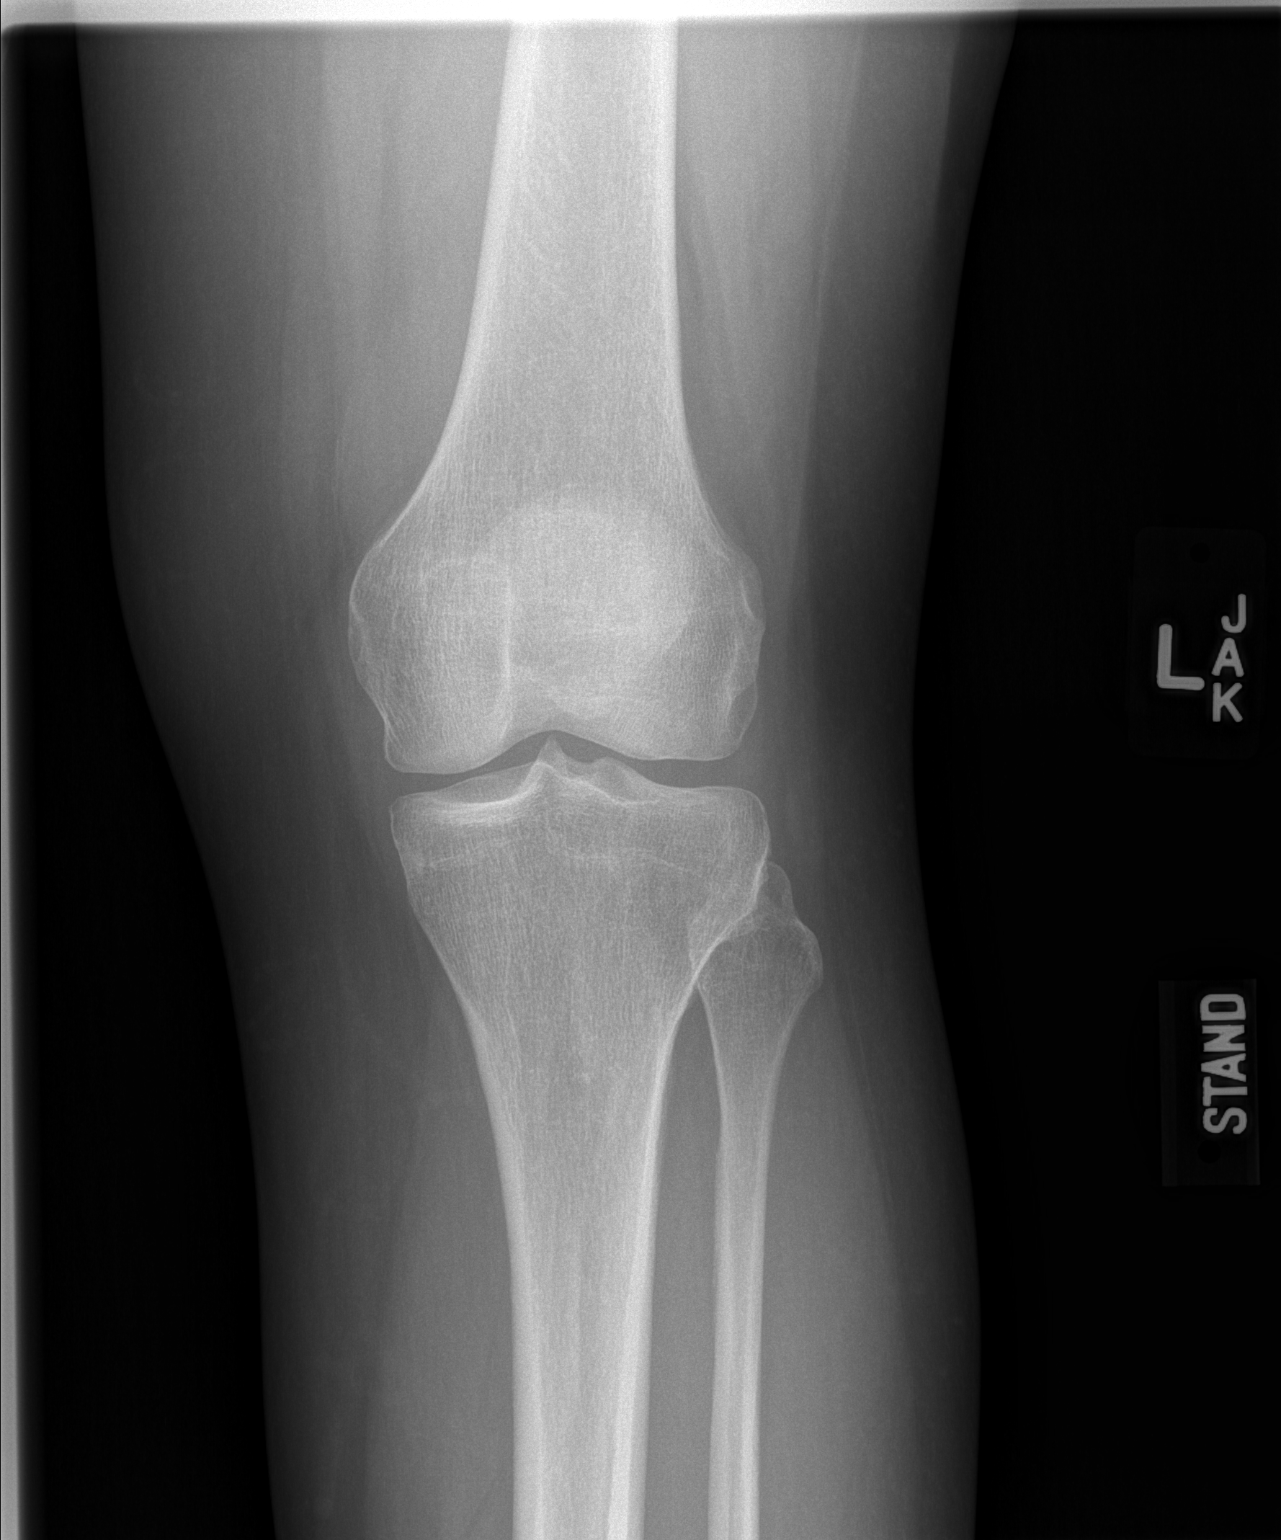

[w knee obl. left (2 of 2)]
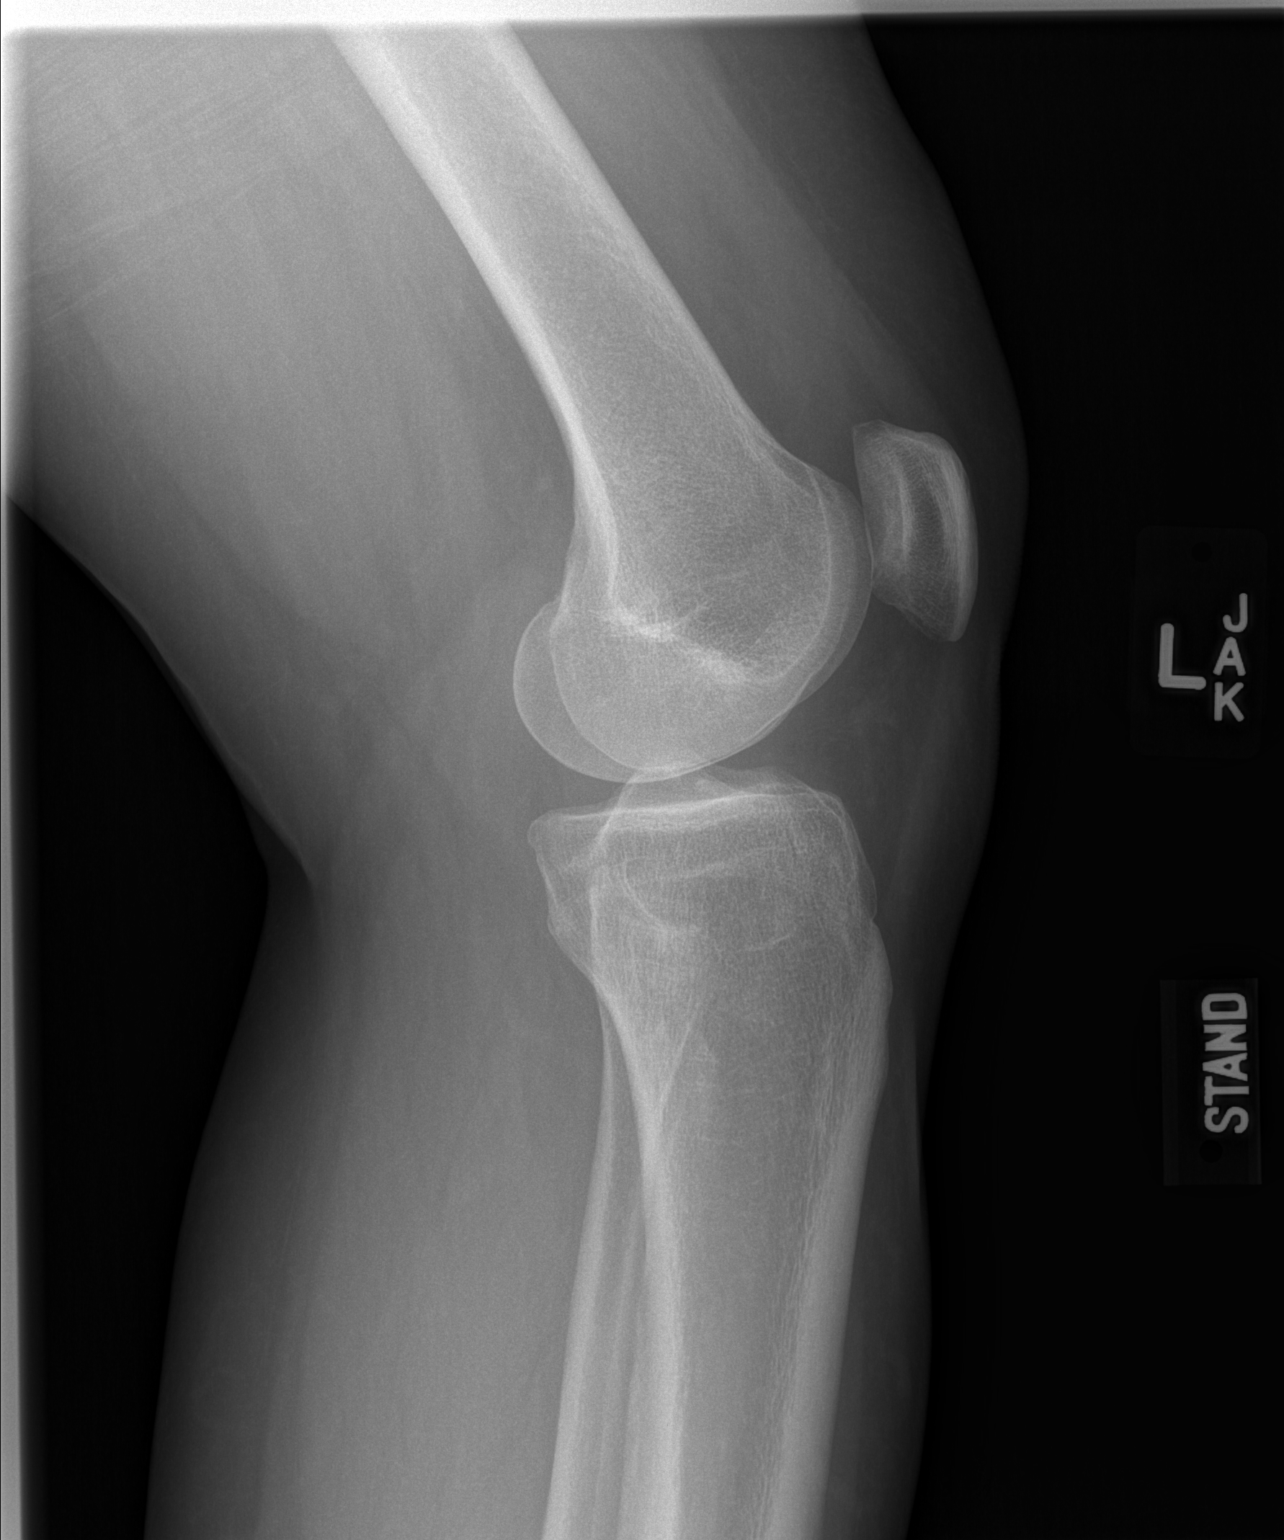

[w knee lat. left]
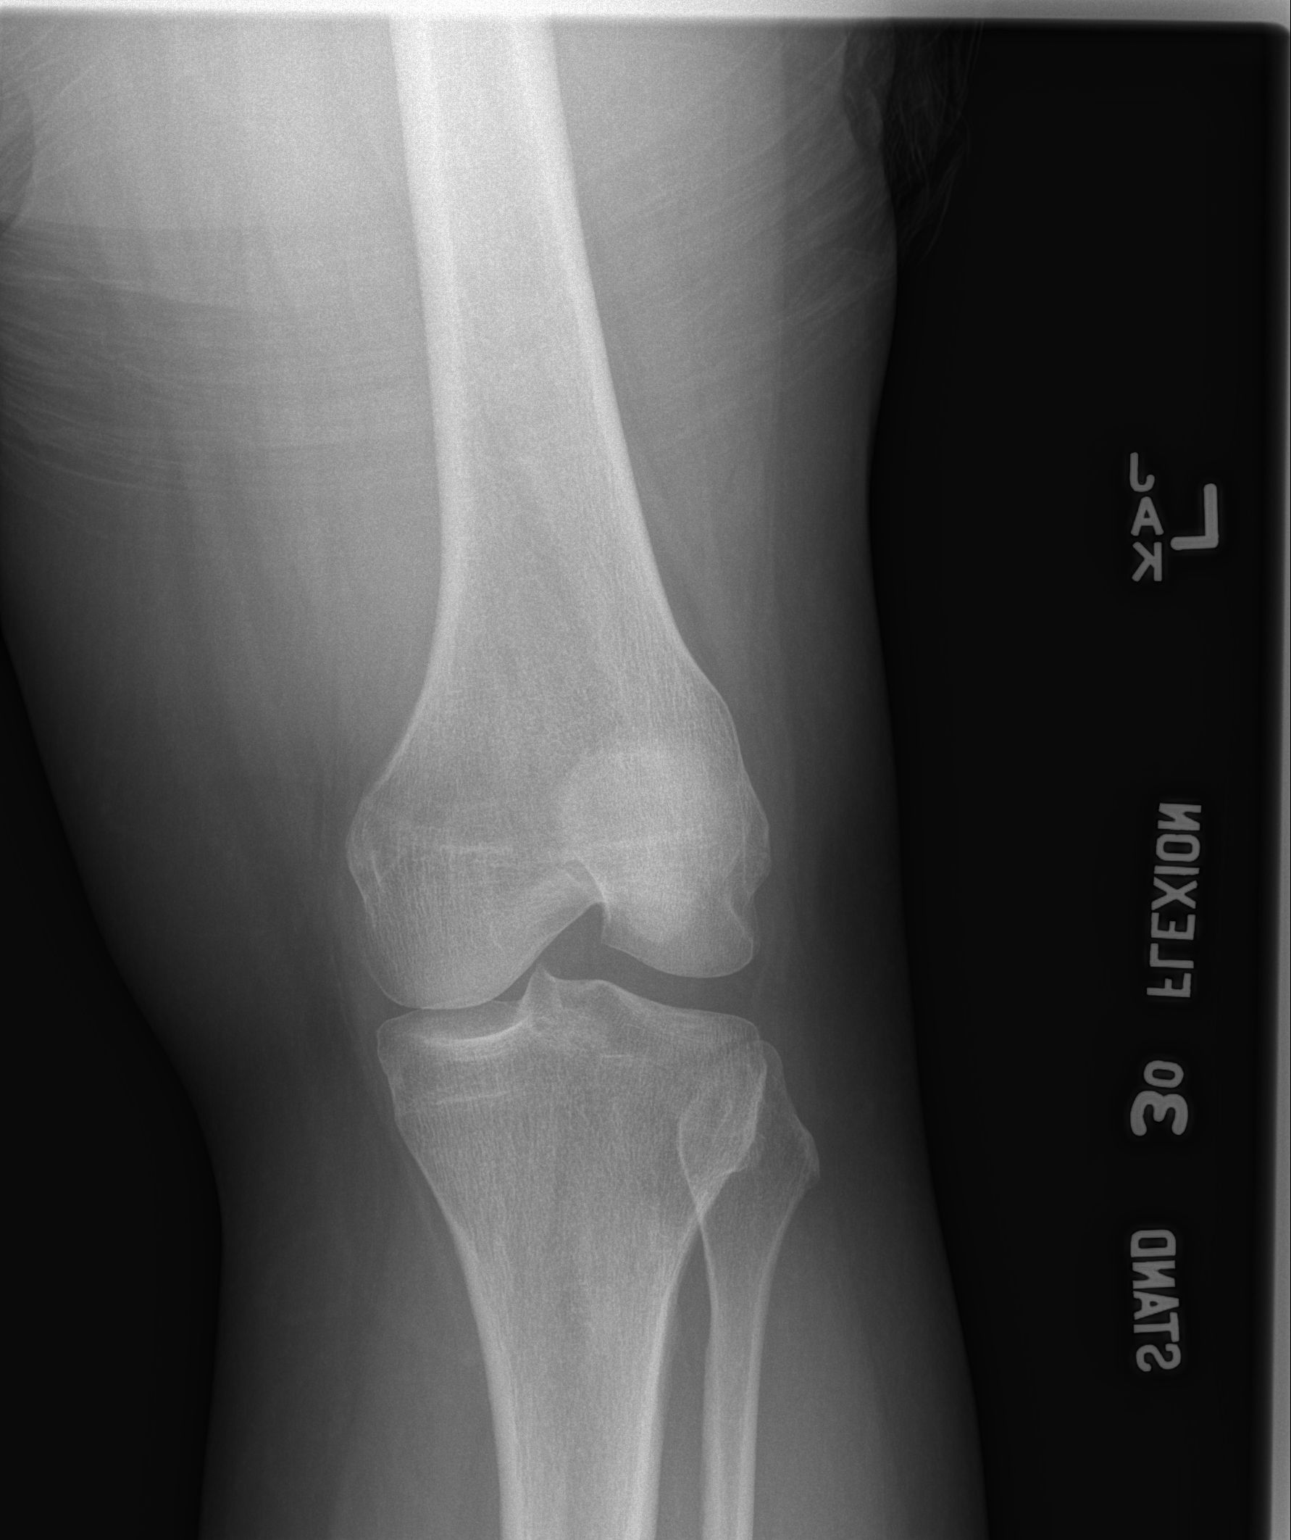

[3 of 3 positions shown; findings below may reference images not displayed]

FINDINGS: No acute bony or joint abnormality. No prominent degenerative
change. Soft tissues are unremarkable.
IMPRESSION: No acute or focal abnormality.

## 2017-08-29 IMAGING — CR DG KNEE COMPLETE 4+V*L*
1 series · 1 of 1 positions shown · non-contrast
Comparison: None.

CLINICAL DATA: Knee pain.  No known injury.  Initial evaluation.

EXAM:
LEFT KNEE - COMPLETE 4+ VIEW

[view not recorded]
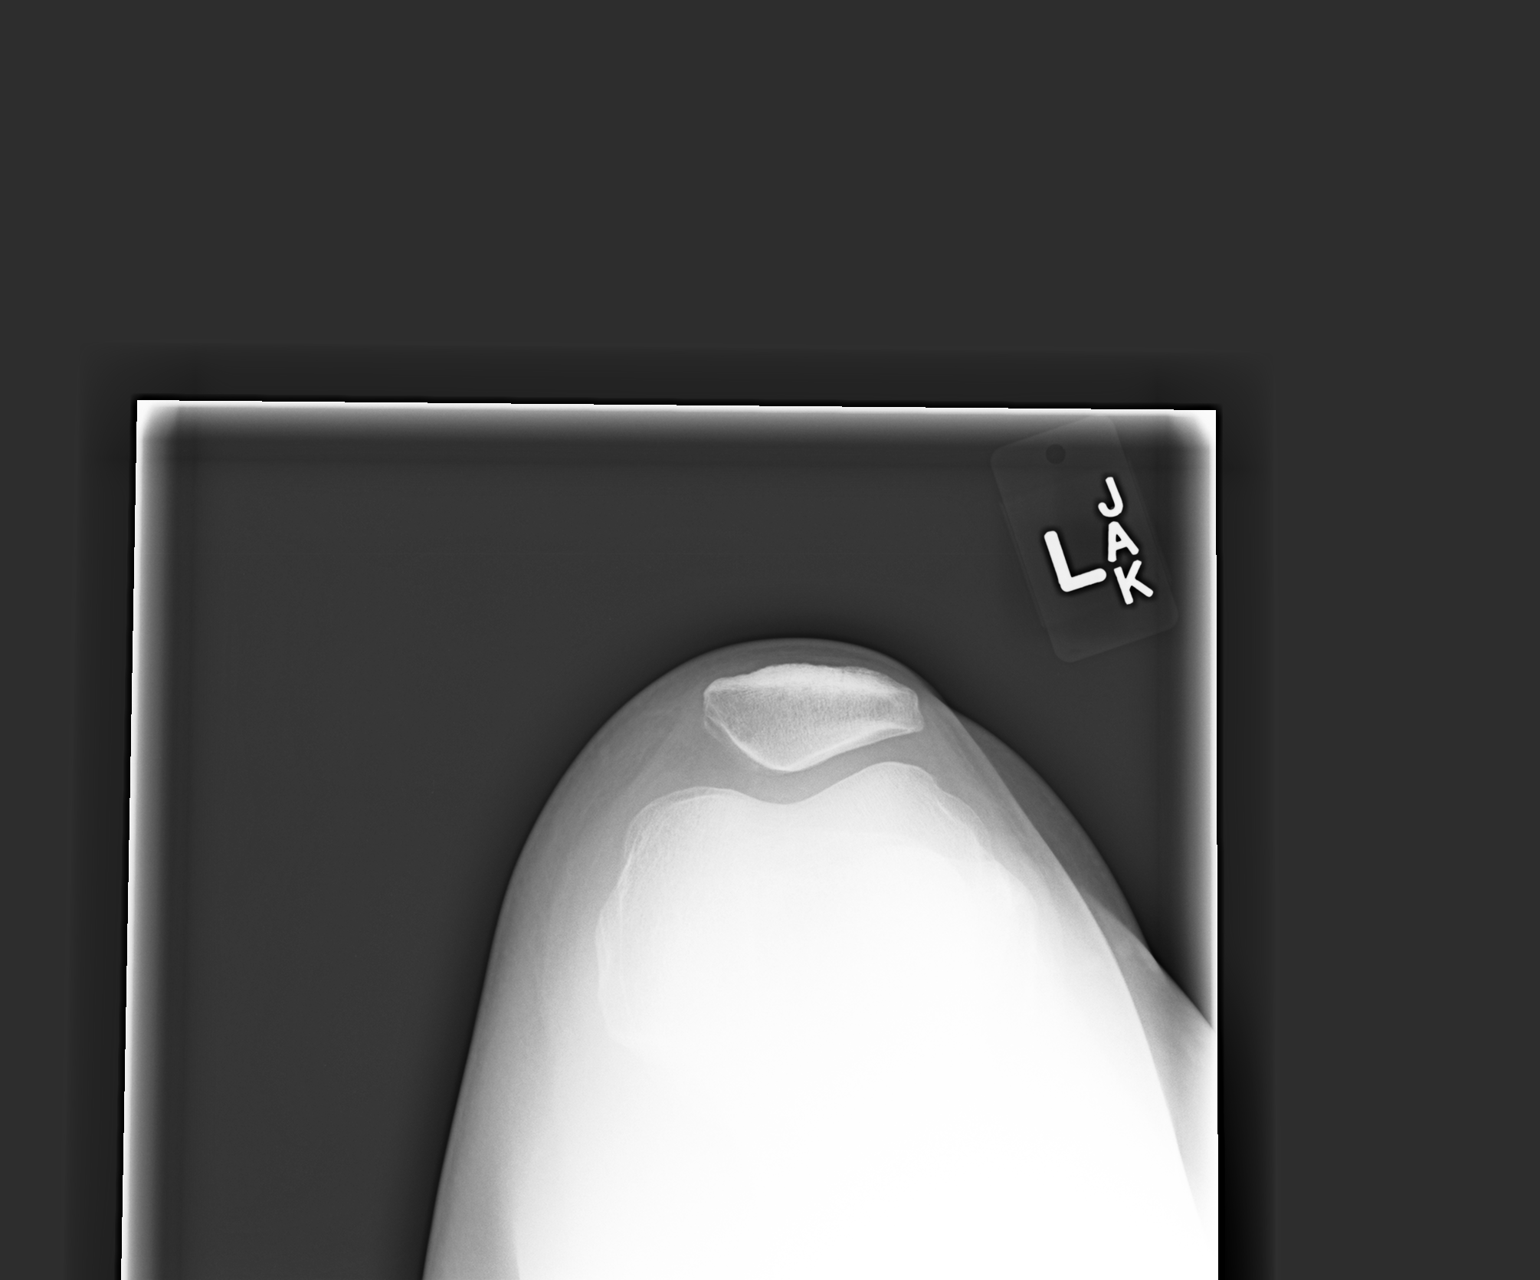

[1 of 1 positions shown; findings below may reference images not displayed]

FINDINGS: No acute bony or joint abnormality. No prominent degenerative
change. Soft tissues are unremarkable.
IMPRESSION: No acute or focal abnormality.

## 2017-09-17 DIAGNOSIS — Z01419 Encounter for gynecological examination (general) (routine) without abnormal findings: Secondary | ICD-10-CM | POA: Diagnosis not present

## 2017-09-17 DIAGNOSIS — Z6830 Body mass index (BMI) 30.0-30.9, adult: Secondary | ICD-10-CM | POA: Diagnosis not present

## 2017-10-04 DIAGNOSIS — S83242A Other tear of medial meniscus, current injury, left knee, initial encounter: Secondary | ICD-10-CM | POA: Diagnosis not present

## 2017-10-04 DIAGNOSIS — G8918 Other acute postprocedural pain: Secondary | ICD-10-CM | POA: Diagnosis not present

## 2017-10-04 DIAGNOSIS — S83222A Peripheral tear of medial meniscus, current injury, left knee, initial encounter: Secondary | ICD-10-CM | POA: Diagnosis not present

## 2017-10-04 DIAGNOSIS — M67862 Other specified disorders of synovium, left knee: Secondary | ICD-10-CM | POA: Diagnosis not present

## 2017-10-04 DIAGNOSIS — M94262 Chondromalacia, left knee: Secondary | ICD-10-CM | POA: Diagnosis not present

## 2017-10-04 DIAGNOSIS — M659 Synovitis and tenosynovitis, unspecified: Secondary | ICD-10-CM | POA: Diagnosis not present

## 2017-10-04 DIAGNOSIS — X58XXXA Exposure to other specified factors, initial encounter: Secondary | ICD-10-CM | POA: Diagnosis not present

## 2017-10-04 DIAGNOSIS — Y999 Unspecified external cause status: Secondary | ICD-10-CM | POA: Diagnosis not present

## 2017-10-15 DIAGNOSIS — M25562 Pain in left knee: Secondary | ICD-10-CM | POA: Diagnosis not present

## 2017-10-18 DIAGNOSIS — M25562 Pain in left knee: Secondary | ICD-10-CM | POA: Diagnosis not present

## 2017-10-25 DIAGNOSIS — M25562 Pain in left knee: Secondary | ICD-10-CM | POA: Diagnosis not present

## 2017-10-30 DIAGNOSIS — M25562 Pain in left knee: Secondary | ICD-10-CM | POA: Diagnosis not present

## 2017-11-02 DIAGNOSIS — M25562 Pain in left knee: Secondary | ICD-10-CM | POA: Diagnosis not present

## 2017-11-06 DIAGNOSIS — M25562 Pain in left knee: Secondary | ICD-10-CM | POA: Diagnosis not present

## 2017-11-09 DIAGNOSIS — M25562 Pain in left knee: Secondary | ICD-10-CM | POA: Diagnosis not present

## 2017-11-13 DIAGNOSIS — M25562 Pain in left knee: Secondary | ICD-10-CM | POA: Diagnosis not present

## 2017-11-15 DIAGNOSIS — M25562 Pain in left knee: Secondary | ICD-10-CM | POA: Diagnosis not present

## 2017-11-19 DIAGNOSIS — M25562 Pain in left knee: Secondary | ICD-10-CM | POA: Diagnosis not present

## 2017-11-22 DIAGNOSIS — M25562 Pain in left knee: Secondary | ICD-10-CM | POA: Diagnosis not present

## 2017-11-26 DIAGNOSIS — M25562 Pain in left knee: Secondary | ICD-10-CM | POA: Diagnosis not present

## 2017-11-29 DIAGNOSIS — M25562 Pain in left knee: Secondary | ICD-10-CM | POA: Diagnosis not present

## 2017-12-04 DIAGNOSIS — M25562 Pain in left knee: Secondary | ICD-10-CM | POA: Diagnosis not present

## 2017-12-06 DIAGNOSIS — M25562 Pain in left knee: Secondary | ICD-10-CM | POA: Diagnosis not present

## 2017-12-11 DIAGNOSIS — M25562 Pain in left knee: Secondary | ICD-10-CM | POA: Diagnosis not present

## 2017-12-13 DIAGNOSIS — M25562 Pain in left knee: Secondary | ICD-10-CM | POA: Diagnosis not present

## 2017-12-24 DIAGNOSIS — M25562 Pain in left knee: Secondary | ICD-10-CM | POA: Diagnosis not present

## 2017-12-27 DIAGNOSIS — M25562 Pain in left knee: Secondary | ICD-10-CM | POA: Diagnosis not present

## 2018-01-01 DIAGNOSIS — M25562 Pain in left knee: Secondary | ICD-10-CM | POA: Diagnosis not present

## 2018-01-03 DIAGNOSIS — M25562 Pain in left knee: Secondary | ICD-10-CM | POA: Diagnosis not present

## 2018-01-09 DIAGNOSIS — M25562 Pain in left knee: Secondary | ICD-10-CM | POA: Diagnosis not present

## 2018-01-11 DIAGNOSIS — M25562 Pain in left knee: Secondary | ICD-10-CM | POA: Diagnosis not present

## 2018-01-15 DIAGNOSIS — M25562 Pain in left knee: Secondary | ICD-10-CM | POA: Diagnosis not present

## 2018-01-17 DIAGNOSIS — M25562 Pain in left knee: Secondary | ICD-10-CM | POA: Diagnosis not present

## 2018-01-29 DIAGNOSIS — Z0000 Encounter for general adult medical examination without abnormal findings: Secondary | ICD-10-CM | POA: Diagnosis not present

## 2018-01-29 DIAGNOSIS — Z114 Encounter for screening for human immunodeficiency virus [HIV]: Secondary | ICD-10-CM | POA: Diagnosis not present

## 2018-01-29 DIAGNOSIS — Z1159 Encounter for screening for other viral diseases: Secondary | ICD-10-CM | POA: Diagnosis not present

## 2018-01-29 DIAGNOSIS — Z1329 Encounter for screening for other suspected endocrine disorder: Secondary | ICD-10-CM | POA: Diagnosis not present

## 2018-02-01 DIAGNOSIS — Z0000 Encounter for general adult medical examination without abnormal findings: Secondary | ICD-10-CM | POA: Diagnosis not present

## 2018-02-11 DIAGNOSIS — L02412 Cutaneous abscess of left axilla: Secondary | ICD-10-CM | POA: Diagnosis not present

## 2018-02-11 DIAGNOSIS — L0291 Cutaneous abscess, unspecified: Secondary | ICD-10-CM | POA: Diagnosis not present

## 2018-02-11 DIAGNOSIS — L02429 Furuncle of limb, unspecified: Secondary | ICD-10-CM | POA: Diagnosis not present

## 2018-02-13 DIAGNOSIS — L02412 Cutaneous abscess of left axilla: Secondary | ICD-10-CM | POA: Diagnosis not present

## 2018-02-14 DIAGNOSIS — L02412 Cutaneous abscess of left axilla: Secondary | ICD-10-CM | POA: Diagnosis not present

## 2018-02-21 DIAGNOSIS — L02412 Cutaneous abscess of left axilla: Secondary | ICD-10-CM | POA: Diagnosis not present

## 2018-03-12 DIAGNOSIS — L02412 Cutaneous abscess of left axilla: Secondary | ICD-10-CM | POA: Diagnosis not present

## 2018-03-15 DIAGNOSIS — L02412 Cutaneous abscess of left axilla: Secondary | ICD-10-CM | POA: Diagnosis not present

## 2018-03-19 DIAGNOSIS — D2239 Melanocytic nevi of other parts of face: Secondary | ICD-10-CM | POA: Diagnosis not present

## 2018-03-19 DIAGNOSIS — A4902 Methicillin resistant Staphylococcus aureus infection, unspecified site: Secondary | ICD-10-CM | POA: Diagnosis not present

## 2018-03-19 DIAGNOSIS — Z86018 Personal history of other benign neoplasm: Secondary | ICD-10-CM | POA: Diagnosis not present

## 2018-03-19 DIAGNOSIS — Z23 Encounter for immunization: Secondary | ICD-10-CM | POA: Diagnosis not present

## 2018-05-25 DIAGNOSIS — J0300 Acute streptococcal tonsillitis, unspecified: Secondary | ICD-10-CM | POA: Diagnosis not present

## 2018-05-25 DIAGNOSIS — J029 Acute pharyngitis, unspecified: Secondary | ICD-10-CM | POA: Diagnosis not present

## 2018-07-16 DIAGNOSIS — M25569 Pain in unspecified knee: Secondary | ICD-10-CM | POA: Diagnosis not present

## 2018-08-09 DIAGNOSIS — M25569 Pain in unspecified knee: Secondary | ICD-10-CM | POA: Diagnosis not present

## 2018-08-20 DIAGNOSIS — M25569 Pain in unspecified knee: Secondary | ICD-10-CM | POA: Diagnosis not present

## 2018-09-23 DIAGNOSIS — M25569 Pain in unspecified knee: Secondary | ICD-10-CM | POA: Diagnosis not present

## 2018-10-07 DIAGNOSIS — M25569 Pain in unspecified knee: Secondary | ICD-10-CM | POA: Diagnosis not present

## 2018-10-22 DIAGNOSIS — M25569 Pain in unspecified knee: Secondary | ICD-10-CM | POA: Diagnosis not present

## 2018-10-31 DIAGNOSIS — M25569 Pain in unspecified knee: Secondary | ICD-10-CM | POA: Diagnosis not present

## 2018-11-13 DIAGNOSIS — B86 Scabies: Secondary | ICD-10-CM | POA: Diagnosis not present

## 2018-11-18 DIAGNOSIS — R5383 Other fatigue: Secondary | ICD-10-CM | POA: Diagnosis not present

## 2018-11-18 DIAGNOSIS — Z1159 Encounter for screening for other viral diseases: Secondary | ICD-10-CM | POA: Diagnosis not present

## 2018-11-18 DIAGNOSIS — N926 Irregular menstruation, unspecified: Secondary | ICD-10-CM | POA: Diagnosis not present

## 2018-11-18 DIAGNOSIS — M791 Myalgia, unspecified site: Secondary | ICD-10-CM | POA: Diagnosis not present

## 2019-02-20 DIAGNOSIS — E559 Vitamin D deficiency, unspecified: Secondary | ICD-10-CM | POA: Diagnosis not present

## 2019-02-20 DIAGNOSIS — R7982 Elevated C-reactive protein (CRP): Secondary | ICD-10-CM | POA: Diagnosis not present

## 2019-02-27 DIAGNOSIS — Z1151 Encounter for screening for human papillomavirus (HPV): Secondary | ICD-10-CM | POA: Diagnosis not present

## 2019-02-27 DIAGNOSIS — Z01419 Encounter for gynecological examination (general) (routine) without abnormal findings: Secondary | ICD-10-CM | POA: Diagnosis not present

## 2019-02-27 DIAGNOSIS — Z1231 Encounter for screening mammogram for malignant neoplasm of breast: Secondary | ICD-10-CM | POA: Diagnosis not present

## 2019-02-27 DIAGNOSIS — Z6828 Body mass index (BMI) 28.0-28.9, adult: Secondary | ICD-10-CM | POA: Diagnosis not present

## 2019-04-01 DIAGNOSIS — D224 Melanocytic nevi of scalp and neck: Secondary | ICD-10-CM | POA: Diagnosis not present

## 2019-04-01 DIAGNOSIS — D485 Neoplasm of uncertain behavior of skin: Secondary | ICD-10-CM | POA: Diagnosis not present

## 2019-04-01 DIAGNOSIS — Z86018 Personal history of other benign neoplasm: Secondary | ICD-10-CM | POA: Diagnosis not present

## 2019-04-01 DIAGNOSIS — D225 Melanocytic nevi of trunk: Secondary | ICD-10-CM | POA: Diagnosis not present

## 2019-04-01 DIAGNOSIS — D2239 Melanocytic nevi of other parts of face: Secondary | ICD-10-CM | POA: Diagnosis not present

## 2019-04-01 DIAGNOSIS — Z23 Encounter for immunization: Secondary | ICD-10-CM | POA: Diagnosis not present

## 2019-04-01 DIAGNOSIS — D2271 Melanocytic nevi of right lower limb, including hip: Secondary | ICD-10-CM | POA: Diagnosis not present

## 2019-04-10 DIAGNOSIS — Z20828 Contact with and (suspected) exposure to other viral communicable diseases: Secondary | ICD-10-CM | POA: Diagnosis not present

## 2019-09-09 DIAGNOSIS — S60450A Superficial foreign body of right index finger, initial encounter: Secondary | ICD-10-CM | POA: Diagnosis not present

## 2019-09-09 DIAGNOSIS — L0390 Cellulitis, unspecified: Secondary | ICD-10-CM | POA: Diagnosis not present

## 2020-04-14 DIAGNOSIS — Z1231 Encounter for screening mammogram for malignant neoplasm of breast: Secondary | ICD-10-CM | POA: Diagnosis not present

## 2020-04-21 DIAGNOSIS — D485 Neoplasm of uncertain behavior of skin: Secondary | ICD-10-CM | POA: Diagnosis not present

## 2020-04-21 DIAGNOSIS — D225 Melanocytic nevi of trunk: Secondary | ICD-10-CM | POA: Diagnosis not present

## 2020-04-21 DIAGNOSIS — L578 Other skin changes due to chronic exposure to nonionizing radiation: Secondary | ICD-10-CM | POA: Diagnosis not present

## 2021-01-12 DIAGNOSIS — Z713 Dietary counseling and surveillance: Secondary | ICD-10-CM | POA: Diagnosis not present

## 2021-04-21 DIAGNOSIS — D225 Melanocytic nevi of trunk: Secondary | ICD-10-CM | POA: Diagnosis not present

## 2021-04-21 DIAGNOSIS — L219 Seborrheic dermatitis, unspecified: Secondary | ICD-10-CM | POA: Diagnosis not present

## 2021-04-21 DIAGNOSIS — D2239 Melanocytic nevi of other parts of face: Secondary | ICD-10-CM | POA: Diagnosis not present

## 2021-04-21 DIAGNOSIS — L578 Other skin changes due to chronic exposure to nonionizing radiation: Secondary | ICD-10-CM | POA: Diagnosis not present

## 2021-05-24 DIAGNOSIS — Z1231 Encounter for screening mammogram for malignant neoplasm of breast: Secondary | ICD-10-CM | POA: Diagnosis not present

## 2021-09-23 DIAGNOSIS — Z9049 Acquired absence of other specified parts of digestive tract: Secondary | ICD-10-CM | POA: Diagnosis not present

## 2021-09-23 DIAGNOSIS — K582 Mixed irritable bowel syndrome: Secondary | ICD-10-CM | POA: Diagnosis not present

## 2021-09-30 DIAGNOSIS — Z0000 Encounter for general adult medical examination without abnormal findings: Secondary | ICD-10-CM | POA: Diagnosis not present

## 2021-09-30 DIAGNOSIS — Z13220 Encounter for screening for lipoid disorders: Secondary | ICD-10-CM | POA: Diagnosis not present

## 2021-09-30 DIAGNOSIS — G5793 Unspecified mononeuropathy of bilateral lower limbs: Secondary | ICD-10-CM | POA: Diagnosis not present

## 2021-10-27 DIAGNOSIS — Z0142 Encounter for cervical smear to confirm findings of recent normal smear following initial abnormal smear: Secondary | ICD-10-CM | POA: Diagnosis not present

## 2021-10-27 DIAGNOSIS — Z6828 Body mass index (BMI) 28.0-28.9, adult: Secondary | ICD-10-CM | POA: Diagnosis not present

## 2021-10-27 DIAGNOSIS — Z01411 Encounter for gynecological examination (general) (routine) with abnormal findings: Secondary | ICD-10-CM | POA: Diagnosis not present

## 2021-10-27 DIAGNOSIS — Z124 Encounter for screening for malignant neoplasm of cervix: Secondary | ICD-10-CM | POA: Diagnosis not present

## 2021-10-27 DIAGNOSIS — Z01419 Encounter for gynecological examination (general) (routine) without abnormal findings: Secondary | ICD-10-CM | POA: Diagnosis not present

## 2021-11-03 DIAGNOSIS — N920 Excessive and frequent menstruation with regular cycle: Secondary | ICD-10-CM | POA: Diagnosis not present

## 2021-11-03 DIAGNOSIS — N926 Irregular menstruation, unspecified: Secondary | ICD-10-CM | POA: Diagnosis not present

## 2021-11-03 DIAGNOSIS — N943 Premenstrual tension syndrome: Secondary | ICD-10-CM | POA: Diagnosis not present

## 2022-02-02 DIAGNOSIS — Z1329 Encounter for screening for other suspected endocrine disorder: Secondary | ICD-10-CM | POA: Diagnosis not present

## 2022-02-02 DIAGNOSIS — E559 Vitamin D deficiency, unspecified: Secondary | ICD-10-CM | POA: Diagnosis not present

## 2022-02-02 DIAGNOSIS — E538 Deficiency of other specified B group vitamins: Secondary | ICD-10-CM | POA: Diagnosis not present

## 2022-02-02 DIAGNOSIS — K589 Irritable bowel syndrome without diarrhea: Secondary | ICD-10-CM | POA: Diagnosis not present

## 2022-02-02 DIAGNOSIS — R5383 Other fatigue: Secondary | ICD-10-CM | POA: Diagnosis not present

## 2022-02-02 DIAGNOSIS — N920 Excessive and frequent menstruation with regular cycle: Secondary | ICD-10-CM | POA: Diagnosis not present

## 2022-02-02 DIAGNOSIS — N12 Tubulo-interstitial nephritis, not specified as acute or chronic: Secondary | ICD-10-CM | POA: Diagnosis not present

## 2022-03-07 DIAGNOSIS — A09 Infectious gastroenteritis and colitis, unspecified: Secondary | ICD-10-CM | POA: Diagnosis not present

## 2022-03-07 DIAGNOSIS — R7982 Elevated C-reactive protein (CRP): Secondary | ICD-10-CM | POA: Diagnosis not present

## 2022-03-07 DIAGNOSIS — U099 Post covid-19 condition, unspecified: Secondary | ICD-10-CM | POA: Diagnosis not present

## 2022-03-07 DIAGNOSIS — E349 Endocrine disorder, unspecified: Secondary | ICD-10-CM | POA: Diagnosis not present

## 2022-03-15 DIAGNOSIS — F411 Generalized anxiety disorder: Secondary | ICD-10-CM | POA: Diagnosis not present

## 2022-04-04 DIAGNOSIS — A09 Infectious gastroenteritis and colitis, unspecified: Secondary | ICD-10-CM | POA: Diagnosis not present

## 2022-04-04 DIAGNOSIS — E349 Endocrine disorder, unspecified: Secondary | ICD-10-CM | POA: Diagnosis not present

## 2022-04-04 DIAGNOSIS — R7982 Elevated C-reactive protein (CRP): Secondary | ICD-10-CM | POA: Diagnosis not present

## 2022-04-04 DIAGNOSIS — U099 Post covid-19 condition, unspecified: Secondary | ICD-10-CM | POA: Diagnosis not present

## 2022-04-13 DIAGNOSIS — M25561 Pain in right knee: Secondary | ICD-10-CM | POA: Diagnosis not present

## 2022-04-13 DIAGNOSIS — F411 Generalized anxiety disorder: Secondary | ICD-10-CM | POA: Diagnosis not present

## 2022-04-13 DIAGNOSIS — M79671 Pain in right foot: Secondary | ICD-10-CM | POA: Diagnosis not present

## 2022-04-21 DIAGNOSIS — D2371 Other benign neoplasm of skin of right lower limb, including hip: Secondary | ICD-10-CM | POA: Diagnosis not present

## 2022-04-21 DIAGNOSIS — L814 Other melanin hyperpigmentation: Secondary | ICD-10-CM | POA: Diagnosis not present

## 2022-04-21 DIAGNOSIS — L578 Other skin changes due to chronic exposure to nonionizing radiation: Secondary | ICD-10-CM | POA: Diagnosis not present

## 2022-04-21 DIAGNOSIS — D485 Neoplasm of uncertain behavior of skin: Secondary | ICD-10-CM | POA: Diagnosis not present

## 2022-04-21 DIAGNOSIS — D225 Melanocytic nevi of trunk: Secondary | ICD-10-CM | POA: Diagnosis not present

## 2022-04-21 DIAGNOSIS — D2261 Melanocytic nevi of right upper limb, including shoulder: Secondary | ICD-10-CM | POA: Diagnosis not present

## 2022-05-10 DIAGNOSIS — F411 Generalized anxiety disorder: Secondary | ICD-10-CM | POA: Diagnosis not present

## 2022-05-11 DIAGNOSIS — M79671 Pain in right foot: Secondary | ICD-10-CM | POA: Diagnosis not present

## 2022-05-16 DIAGNOSIS — E039 Hypothyroidism, unspecified: Secondary | ICD-10-CM | POA: Diagnosis not present

## 2022-05-16 DIAGNOSIS — R7982 Elevated C-reactive protein (CRP): Secondary | ICD-10-CM | POA: Diagnosis not present

## 2022-05-16 DIAGNOSIS — U099 Post covid-19 condition, unspecified: Secondary | ICD-10-CM | POA: Diagnosis not present

## 2022-05-16 DIAGNOSIS — E559 Vitamin D deficiency, unspecified: Secondary | ICD-10-CM | POA: Diagnosis not present

## 2022-05-16 DIAGNOSIS — E349 Endocrine disorder, unspecified: Secondary | ICD-10-CM | POA: Diagnosis not present

## 2022-05-16 DIAGNOSIS — E538 Deficiency of other specified B group vitamins: Secondary | ICD-10-CM | POA: Diagnosis not present

## 2022-05-16 DIAGNOSIS — A09 Infectious gastroenteritis and colitis, unspecified: Secondary | ICD-10-CM | POA: Diagnosis not present

## 2022-05-24 DIAGNOSIS — F411 Generalized anxiety disorder: Secondary | ICD-10-CM | POA: Diagnosis not present

## 2022-06-06 DIAGNOSIS — E039 Hypothyroidism, unspecified: Secondary | ICD-10-CM | POA: Diagnosis not present

## 2022-06-06 DIAGNOSIS — R7982 Elevated C-reactive protein (CRP): Secondary | ICD-10-CM | POA: Diagnosis not present

## 2022-06-06 DIAGNOSIS — U099 Post covid-19 condition, unspecified: Secondary | ICD-10-CM | POA: Diagnosis not present

## 2022-06-06 DIAGNOSIS — A09 Infectious gastroenteritis and colitis, unspecified: Secondary | ICD-10-CM | POA: Diagnosis not present

## 2022-06-07 DIAGNOSIS — F411 Generalized anxiety disorder: Secondary | ICD-10-CM | POA: Diagnosis not present

## 2022-06-28 DIAGNOSIS — F411 Generalized anxiety disorder: Secondary | ICD-10-CM | POA: Diagnosis not present

## 2022-07-07 DIAGNOSIS — Z1231 Encounter for screening mammogram for malignant neoplasm of breast: Secondary | ICD-10-CM | POA: Diagnosis not present

## 2022-07-12 DIAGNOSIS — F411 Generalized anxiety disorder: Secondary | ICD-10-CM | POA: Diagnosis not present

## 2022-07-26 DIAGNOSIS — F411 Generalized anxiety disorder: Secondary | ICD-10-CM | POA: Diagnosis not present

## 2022-08-15 DIAGNOSIS — E559 Vitamin D deficiency, unspecified: Secondary | ICD-10-CM | POA: Diagnosis not present

## 2022-08-15 DIAGNOSIS — E039 Hypothyroidism, unspecified: Secondary | ICD-10-CM | POA: Diagnosis not present

## 2022-08-15 DIAGNOSIS — R7982 Elevated C-reactive protein (CRP): Secondary | ICD-10-CM | POA: Diagnosis not present

## 2022-08-15 DIAGNOSIS — A09 Infectious gastroenteritis and colitis, unspecified: Secondary | ICD-10-CM | POA: Diagnosis not present

## 2022-08-15 DIAGNOSIS — E349 Endocrine disorder, unspecified: Secondary | ICD-10-CM | POA: Diagnosis not present

## 2022-08-15 DIAGNOSIS — U099 Post covid-19 condition, unspecified: Secondary | ICD-10-CM | POA: Diagnosis not present

## 2022-08-16 DIAGNOSIS — F411 Generalized anxiety disorder: Secondary | ICD-10-CM | POA: Diagnosis not present

## 2022-08-29 DIAGNOSIS — U099 Post covid-19 condition, unspecified: Secondary | ICD-10-CM | POA: Diagnosis not present

## 2022-08-29 DIAGNOSIS — E039 Hypothyroidism, unspecified: Secondary | ICD-10-CM | POA: Diagnosis not present

## 2022-08-29 DIAGNOSIS — A09 Infectious gastroenteritis and colitis, unspecified: Secondary | ICD-10-CM | POA: Diagnosis not present

## 2022-08-29 DIAGNOSIS — R7982 Elevated C-reactive protein (CRP): Secondary | ICD-10-CM | POA: Diagnosis not present

## 2022-08-30 DIAGNOSIS — F411 Generalized anxiety disorder: Secondary | ICD-10-CM | POA: Diagnosis not present

## 2022-09-20 DIAGNOSIS — F411 Generalized anxiety disorder: Secondary | ICD-10-CM | POA: Diagnosis not present

## 2022-10-04 DIAGNOSIS — F411 Generalized anxiety disorder: Secondary | ICD-10-CM | POA: Diagnosis not present

## 2022-11-01 DIAGNOSIS — F411 Generalized anxiety disorder: Secondary | ICD-10-CM | POA: Diagnosis not present

## 2022-11-15 DIAGNOSIS — U099 Post covid-19 condition, unspecified: Secondary | ICD-10-CM | POA: Diagnosis not present

## 2022-11-15 DIAGNOSIS — R7982 Elevated C-reactive protein (CRP): Secondary | ICD-10-CM | POA: Diagnosis not present

## 2022-11-15 DIAGNOSIS — E559 Vitamin D deficiency, unspecified: Secondary | ICD-10-CM | POA: Diagnosis not present

## 2022-11-15 DIAGNOSIS — A09 Infectious gastroenteritis and colitis, unspecified: Secondary | ICD-10-CM | POA: Diagnosis not present

## 2022-11-15 DIAGNOSIS — E538 Deficiency of other specified B group vitamins: Secondary | ICD-10-CM | POA: Diagnosis not present

## 2022-11-15 DIAGNOSIS — R7989 Other specified abnormal findings of blood chemistry: Secondary | ICD-10-CM | POA: Diagnosis not present

## 2022-11-15 DIAGNOSIS — E039 Hypothyroidism, unspecified: Secondary | ICD-10-CM | POA: Diagnosis not present

## 2022-11-29 DIAGNOSIS — F411 Generalized anxiety disorder: Secondary | ICD-10-CM | POA: Diagnosis not present

## 2022-11-30 DIAGNOSIS — R7982 Elevated C-reactive protein (CRP): Secondary | ICD-10-CM | POA: Diagnosis not present

## 2022-11-30 DIAGNOSIS — E039 Hypothyroidism, unspecified: Secondary | ICD-10-CM | POA: Diagnosis not present

## 2022-11-30 DIAGNOSIS — U099 Post covid-19 condition, unspecified: Secondary | ICD-10-CM | POA: Diagnosis not present

## 2022-11-30 DIAGNOSIS — A09 Infectious gastroenteritis and colitis, unspecified: Secondary | ICD-10-CM | POA: Diagnosis not present

## 2023-01-03 DIAGNOSIS — F411 Generalized anxiety disorder: Secondary | ICD-10-CM | POA: Diagnosis not present

## 2023-01-09 DIAGNOSIS — M1990 Unspecified osteoarthritis, unspecified site: Secondary | ICD-10-CM | POA: Insufficient documentation

## 2023-01-10 ENCOUNTER — Other Ambulatory Visit (INDEPENDENT_AMBULATORY_CARE_PROVIDER_SITE_OTHER): Payer: BC Managed Care – PPO

## 2023-01-10 ENCOUNTER — Encounter: Payer: Self-pay | Admitting: Gastroenterology

## 2023-01-10 ENCOUNTER — Ambulatory Visit (INDEPENDENT_AMBULATORY_CARE_PROVIDER_SITE_OTHER): Payer: BC Managed Care – PPO | Admitting: Physician Assistant

## 2023-01-10 ENCOUNTER — Encounter: Payer: Self-pay | Admitting: Physician Assistant

## 2023-01-10 DIAGNOSIS — R109 Unspecified abdominal pain: Secondary | ICD-10-CM

## 2023-01-10 DIAGNOSIS — K5900 Constipation, unspecified: Secondary | ICD-10-CM | POA: Diagnosis not present

## 2023-01-10 DIAGNOSIS — R197 Diarrhea, unspecified: Secondary | ICD-10-CM

## 2023-01-10 LAB — CBC WITH DIFFERENTIAL/PLATELET
Basophils Absolute: 0 10*3/uL (ref 0.0–0.1)
Basophils Relative: 0.5 % (ref 0.0–3.0)
Eosinophils Absolute: 0.1 10*3/uL (ref 0.0–0.7)
Eosinophils Relative: 1.3 % (ref 0.0–5.0)
HCT: 40.8 % (ref 36.0–46.0)
Hemoglobin: 13.5 g/dL (ref 12.0–15.0)
Lymphocytes Relative: 23.9 % (ref 12.0–46.0)
Lymphs Abs: 1.6 10*3/uL (ref 0.7–4.0)
MCHC: 33.1 g/dL (ref 30.0–36.0)
MCV: 89.6 fl (ref 78.0–100.0)
Monocytes Absolute: 0.4 10*3/uL (ref 0.1–1.0)
Monocytes Relative: 5.8 % (ref 3.0–12.0)
Neutro Abs: 4.5 10*3/uL (ref 1.4–7.7)
Neutrophils Relative %: 68.5 % (ref 43.0–77.0)
Platelets: 284 10*3/uL (ref 150.0–400.0)
RBC: 4.56 Mil/uL (ref 3.87–5.11)
RDW: 13.1 % (ref 11.5–15.5)
WBC: 6.6 10*3/uL (ref 4.0–10.5)

## 2023-01-10 LAB — TSH: TSH: 1.81 u[IU]/mL (ref 0.35–5.50)

## 2023-01-10 LAB — C-REACTIVE PROTEIN: CRP: <1 mg/dL (ref 0.5–20.0)

## 2023-01-10 LAB — SEDIMENTATION RATE: Sed Rate: 16 mm/h (ref 0–20)

## 2023-01-10 MED ORDER — NA SULFATE-K SULFATE-MG SULF 17.5-3.13-1.6 GM/177ML PO SOLN: 1.0000 | ORAL | 0 refills | Status: DC

## 2023-01-10 NOTE — Patient Instructions (Addendum)
_______________________________________________________  If your blood pressure at your visit was 140/90 or greater, please contact your primary care physician to follow up on this.  If you are age 43 or younger, your body mass index should be between 19-25. Your Body mass index is 28.63 kg/m. If this is out of the aformentioned range listed, please consider follow up with your Primary Care Provider.  ________________________________________________________  The [facility] providers would like to encourage you to use [employer] to communicate with providers for non-urgent requests or questions.  Due to long hold times on the telephone, sending your provider a message by [facility] may be a faster and more efficient way to get a response.  Please allow 48 business hours for a response.  Please remember that this is for non-urgent requests.  _______________________________________________________  Please purchase the following medications over the counter and take as directed:  START: Benefiber 1 dose daily in a glass of water.  You can use prunes and or kiwi daily to help with bowel movements.  Your provider has requested that you go to the basement level for lab work before leaving today. Press "B" on the elevator. The lab is located at the first door on the left as you exit the elevator.  You have been scheduled for a colonoscopy. Please follow written instructions given to you at your visit today.   Please pick up your prep supplies at the pharmacy within the next 1-3 days.  If you use inhalers (even only as needed), please bring them with you on the day of your procedure.  DO NOT TAKE 7 DAYS PRIOR TO TEST- Trulicity (dulaglutide) Ozempic, Wegovy (semaglutide) Mounjaro (tirzepatide) Bydureon Bcise (exanatide extended release)  DO NOT TAKE 1 DAY PRIOR TO YOUR TEST Rybelsus (semaglutide) Adlyxin (lixisenatide) Victoza (liraglutide) Byetta  (exanatide) ___________________________________________________________________________  Due to recent changes in healthcare laws, you may see the results of your imaging and laboratory studies on MyChart before your provider has had a chance to review them.  We understand that in some cases there may be results that are confusing or concerning to you. Not all laboratory results come back in the same time frame and the provider may be waiting for multiple results in order to interpret others.  Please give [place] 48 hours in order for your provider to thoroughly review all the results before contacting the office for clarification of your results.   Thank you for entrusting me with your care and choosing [facility].  Amy Esterwood, PA-C

## 2023-01-11 ENCOUNTER — Encounter: Payer: Self-pay | Admitting: Physician Assistant

## 2023-01-11 LAB — TISSUE TRANSGLUTAMINASE, IGA: (tTG) Ab, IgA: <1 U/mL

## 2023-01-11 LAB — IGA: Immunoglobulin A: 231 mg/dL (ref 47–310)

## 2023-01-11 NOTE — Progress Notes (Addendum)
Subjective:    Patient ID: [name], female    DOB: 06-13-1980, 42 y.o.   MRN: [id number]  HPI Shervon is a 42 year old white female, new to GI today referred by [facility] for evaluation of abdominal pain and altered bowel habits. She is generally in good health, with history of asthma, is status post cholecystectomy, and carries a diagnosis of long COVID. She has not had any prior GI evaluation. She says that she has had chronic issues with constipation for several years, and used to have a couple of days between bowel movements.  This has progressively worsened and she has recently gone as long as 10 days without a bowel movement and says at times she just has not had an urge for a bowel movement.  These long episodes of constipation will sometimes alternate with an episode of diarrhea.  She is not aware of any specific triggers for the diarrhea. She has also had heartburn off and on which may occur with or without p.o. intake.  No dysphagia or odynophagia.  She has not been on any medication for acid reflux. She does not like to take medications if she does not have to.  She has not been using anything for constipation other than prune juice which has not been working well.  Sometimes diarrhea triggered by greasy foods and lettuce.  Sometimes will have episodes of abdominal cramping which can be bad.  No melena or hematochezia. She is not aware of any food allergies and believes she has been tested through integrative health, no family history of celiac disease, was told that she tested positive for parasites by integrative health last year and was given some sort of treatment but is not sure what that was, she thinks perhaps metformin.  Review of Systems Pertinent positive and negative review of systems were noted in the above HPI section.  All other review of systems was otherwise negative.   Outpatient Encounter Medications as of 01/10/2023  Medication Sig    Alpha-Lipoic Acid 600 MG CAPS 1 capsule daily.   AMBULATORY NON FORMULARY MEDICATION 1 capsule daily. JUICE PLUS OMEGA BLEND   AMBULATORY NON FORMULARY MEDICATION 500 mg 2 (two) times daily. Medication Name: ESTROGEN CONTROL   AMBULATORY NON FORMULARY MEDICATION 1 capsule daily. Medication Name: BIO-QUERCETIN   AMBULATORY NON FORMULARY MEDICATION 10 mg daily in the afternoon. Medication Name: SPERMIDINE   B Complex Vitamins (B COMPLEX PO) 1 tablet daily.   Misc Natural Products (NF FORMULAS TESTOSTERONE PO) Take 2.5 mg by mouth 2 (two) times daily. Sublingular   Na Sulfate-K Sulfate-Mg Sulf (SUPREP BOWEL PREP KIT) 17.5-3.13-1.6 GM/177ML SOLN Take 1 kit by mouth as directed.   Prasterone, DHEA, (DHEA 50 PO) 1 capsule daily.   Probiotic Product (PROBIOTIC PO) Take 1 tablet by mouth daily.   PROGESTERONE PO 200 mg daily.   thyroid (NP THYROID) 15 MG tablet Take 1 tablet by mouth every morning.   Vitamin D-Vitamin K (VITAMIN K2-VITAMIN D3 PO) 1 tablet daily.   albuterol (PROVENTIL HFA;VENTOLIN HFA) 108 (90 BASE) MCG/ACT inhaler Inhale 2 puffs into the lungs every 6 (six) hours as needed for wheezing or shortness of breath (rescue).   QVAR 80 MCG/ACT inhaler USE 2 INHALATIONS TWICE A DAY (Patient not taking: Reported on 01/28/2016)   [DISCONTINUED] ibuprofen (ADVIL,MOTRIN) 600 MG tablet Take 1 tablet (600 mg total) by mouth every 6 (six) hours.   [DISCONTINUED] oxyCODONE-acetaminophen (PERCOCET/ROXICET) 5-325 MG tablet Take 1-2 tablets by mouth every 4 (four)  hours as needed for moderate pain.   No facility-administered encounter medications on file as of 01/10/2023.   Allergies  Allergen Reactions   Morphine And Codeine Shortness Of Breath   Patient Active Problem List   Diagnosis Date Noted   Arthritis 01/09/2023   Postpartum care following cesarean delivery (9/22) 01/29/2016   ALLERGIC RHINITIS 07/27/2009   Asthma 03/26/2007   Social History   Socioeconomic History   Marital status:  Married    Spouse name: Not on file   Number of children: Not on file   Years of education: Not on file   Highest education level: Not on file  Occupational History   Not on file  Tobacco Use   Smoking status: Never   Smokeless tobacco: Never  Vaping Use   Vaping status: Never Used  Substance and Sexual Activity   Alcohol use: No   Drug use: No   Sexual activity: Not Currently  Other Topics Concern   Not on file  Social History Narrative   Not on file   Social Determinants of Health   Financial Resource Strain: Not on file  Food Insecurity: Not on file  Transportation Needs: Not on file  Physical Activity: Not on file  Stress: Not on file  Social Connections: Not on file  Intimate Partner Violence: Not on file    Ms. Deboard's family history includes Anemia in her mother; Breast cancer in her maternal grandmother; Heart disease in her father; Hypertension in her maternal aunt; Irritable bowel syndrome in her mother; Seizures in her mother; Skin cancer in her mother.      Objective:    Vitals:   01/10/23 1029  BP: 122/86  Pulse: 84    Physical Exam Well-developed well-nourished white female in no acute distress.  Height, Weight, 211 BMI 28.6  HEENT; nontraumatic normocephalic, EOMI, PE R LA, sclera anicteric. Oropharynx; not examined today Neck; supple, no JVD Cardiovascular; regular rate and rhythm with S1-S2, no murmur rub or gallop Pulmonary; Clear bilaterally Abdomen; soft, nontender, nondistended, no palpable mass or hepatosplenomegaly, bowel sounds are active, low midline incisional scar Rectal; not done today Skin; benign exam, no jaundice rash or appreciable lesions Extremities; no clubbing cyanosis or edema skin warm and dry Neuro/Psych; alert and oriented x4, grossly nonfocal mood and affect appropriate         Assessment & Plan:   #79 42 year old white female with several year history of constipation which has been progressive, sometimes has  gone as long as 10 days between bowel movements, intermittent episodes of diarrhea, intermittent abdominal cramping.  Etiology not entirely clear, this may be slow transit functional constipation, consider hypothyroidism, consider underlying IBD , IBS-C  #2 status postcholecystectomy #3.  Status post C-section x 3  Plan; CBC with differential, sed rate, CRP, TTG and IgA, TSH Dartt Benefiber 1 scoop daily in a glass of water.  She would prefer you to use natural supplements etc. advise she could drink prune juice and/or eat 4-5 prunes daily and/or consider eating kiwi 2/day to help with constipation. Patient will be scheduled for colonoscopy with Dr. Meridee Score .  Procedure was discussed in detail with the patient including indications risk and benefits and she is agreeable to proceed.  Further recommendations pending results of above.   (Initially sent to [facility] in error)  Sammuel Cooper PA-C 01/11/2023   Cc: Stephanie Coup, FNP

## 2023-01-12 NOTE — Progress Notes (Signed)
Attending Physician's Attestation   I have reviewed the chart.   I agree with the Advanced Practitioner's note, impression, and recommendations with any updates as below.    Gabriel Mansouraty, MD [facility] Advanced [facility]oscopy Office # [phone]  

## 2023-01-13 ENCOUNTER — Encounter: Payer: Self-pay | Admitting: Certified Registered Nurse Anesthetist

## 2023-01-16 DIAGNOSIS — Z0000 Encounter for general adult medical examination without abnormal findings: Secondary | ICD-10-CM | POA: Diagnosis not present

## 2023-01-16 DIAGNOSIS — Z13220 Encounter for screening for lipoid disorders: Secondary | ICD-10-CM | POA: Diagnosis not present

## 2023-01-19 ENCOUNTER — Encounter: Payer: Self-pay | Admitting: Gastroenterology

## 2023-01-19 ENCOUNTER — Ambulatory Visit (AMBULATORY_SURGERY_CENTER): Payer: BC Managed Care – PPO | Admitting: Gastroenterology

## 2023-01-19 VITALS — BP 123/59 | HR 69 | Temp 98.0°F | Resp 9 | Ht 72.0 in | Wt 211.0 lb

## 2023-01-19 DIAGNOSIS — R197 Diarrhea, unspecified: Secondary | ICD-10-CM

## 2023-01-19 DIAGNOSIS — R109 Unspecified abdominal pain: Secondary | ICD-10-CM

## 2023-01-19 DIAGNOSIS — K5900 Constipation, unspecified: Secondary | ICD-10-CM

## 2023-01-19 DIAGNOSIS — D127 Benign neoplasm of rectosigmoid junction: Secondary | ICD-10-CM | POA: Diagnosis not present

## 2023-01-19 DIAGNOSIS — R194 Change in bowel habit: Secondary | ICD-10-CM | POA: Diagnosis not present

## 2023-01-19 DIAGNOSIS — K635 Polyp of colon: Secondary | ICD-10-CM | POA: Diagnosis not present

## 2023-01-19 HISTORY — PX: COLONOSCOPY: SHX174

## 2023-01-19 MED ORDER — SODIUM CHLORIDE 0.9 % IV SOLN: 500.0000 mL | Freq: Once | INTRAVENOUS | Status: DC

## 2023-01-19 NOTE — Progress Notes (Signed)
Report given to PACU, vss 

## 2023-01-19 NOTE — Progress Notes (Signed)
GASTROENTEROLOGY PROCEDURE H&P NOTE   Primary Care Physician: Shaune Pollack, MD (Inactive)  HPI: [name] is a 42 y.o. female who presents for Colonoscopy for evaluation of alternating bowel habits of constipation/diarrhea and abdominal cramping/pain.  Past Medical History:  Diagnosis Date   Allergic rhinitis    Arthritis    Asthma    Cough    History of gallstones    Thyroid disease    Past Surgical History:  Procedure Laterality Date   CESAREAN SECTION N/A 05/12/2014   Procedure: CESAREAN SECTION;  Surgeon: Zelphia Cairo, MD;  Location: [facility];  Service: Obstetrics;  Laterality: N/A;   CESAREAN SECTION N/A 01/28/2016   Procedure: CESAREAN SECTION;  Surgeon: Noland Fordyce, MD;  Location: [facility];  Service: Obstetrics;  Laterality: N/A;   CESAREAN SECTION CLASSICAL  05/08/2010   CHOLECYSTECTOMY  05/08/2008   DILATION AND EVACUATION N/A 05/16/2013   Procedure: DILATATION AND EVACUATION;  Surgeon: Zelphia Cairo, MD;  Location: [facility];  Service: Gynecology;  Laterality: N/A;   ESOPHAGOGASTRODUODENOSCOPY     over 15 years ago   KNEE SURGERY Left    WISDOM TOOTH EXTRACTION  05/09/1995   Current Outpatient Medications  Medication Sig Dispense Refill   albuterol (PROVENTIL HFA;VENTOLIN HFA) 108 (90 BASE) MCG/ACT inhaler Inhale 2 puffs into the lungs every 6 (six) hours as needed for wheezing or shortness of breath (rescue). 3 Inhaler 0   Alpha-Lipoic Acid 600 MG CAPS 1 capsule daily.     AMBULATORY NON FORMULARY MEDICATION 1 capsule daily. JUICE PLUS OMEGA BLEND     AMBULATORY NON FORMULARY MEDICATION 500 mg 2 (two) times daily. Medication Name: ESTROGEN CONTROL     AMBULATORY NON FORMULARY MEDICATION 1 capsule daily. Medication Name: BIO-QUERCETIN     AMBULATORY NON FORMULARY MEDICATION 10 mg daily in the afternoon. Medication Name: SPERMIDINE     B Complex Vitamins (B COMPLEX PO) 1 tablet daily.     Misc Natural Products (NF FORMULAS TESTOSTERONE PO)  Take 2.5 mg by mouth 2 (two) times daily. Sublingular     Na Sulfate-K Sulfate-Mg Sulf (SUPREP BOWEL PREP KIT) 17.5-3.13-1.6 GM/177ML SOLN Take 1 kit by mouth as directed. 324 mL 0   Prasterone, DHEA, (DHEA 50 PO) 1 capsule daily.     Probiotic Product (PROBIOTIC PO) Take 1 tablet by mouth daily.     PROGESTERONE PO 200 mg daily.     QVAR 80 MCG/ACT inhaler USE 2 INHALATIONS TWICE A DAY (Patient not taking: Reported on 01/28/2016) 1 Inhaler 0   thyroid (NP THYROID) 15 MG tablet Take 1 tablet by mouth every morning.     Vitamin D-Vitamin K (VITAMIN K2-VITAMIN D3 PO) 1 tablet daily.     No current facility-administered medications for this visit.    Current Outpatient Medications:    albuterol (PROVENTIL HFA;VENTOLIN HFA) 108 (90 BASE) MCG/ACT inhaler, Inhale 2 puffs into the lungs every 6 (six) hours as needed for wheezing or shortness of breath (rescue)., Disp: 3 Inhaler, Rfl: 0   Alpha-Lipoic Acid 600 MG CAPS, 1 capsule daily., Disp: , Rfl:    AMBULATORY NON FORMULARY MEDICATION, 1 capsule daily. JUICE PLUS OMEGA BLEND, Disp: , Rfl:    AMBULATORY NON FORMULARY MEDICATION, 500 mg 2 (two) times daily. Medication Name: ESTROGEN CONTROL, Disp: , Rfl:    AMBULATORY NON FORMULARY MEDICATION, 1 capsule daily. Medication Name: BIO-QUERCETIN, Disp: , Rfl:    AMBULATORY NON FORMULARY MEDICATION, 10 mg daily in the afternoon. Medication Name: SPERMIDINE, Disp: , Rfl:  B Complex Vitamins (B COMPLEX PO), 1 tablet daily., Disp: , Rfl:    Misc Natural Products (NF FORMULAS TESTOSTERONE PO), Take 2.5 mg by mouth 2 (two) times daily. Sublingular, Disp: , Rfl:    Na Sulfate-K Sulfate-Mg Sulf (SUPREP BOWEL PREP KIT) 17.5-3.13-1.6 GM/177ML SOLN, Take 1 kit by mouth as directed., Disp: 324 mL, Rfl: 0   Prasterone, DHEA, (DHEA 50 PO), 1 capsule daily., Disp: , Rfl:    Probiotic Product (PROBIOTIC PO), Take 1 tablet by mouth daily., Disp: , Rfl:    PROGESTERONE PO, 200 mg daily., Disp: , Rfl:    QVAR 80 MCG/ACT  inhaler, USE 2 INHALATIONS TWICE A DAY (Patient not taking: Reported on 01/28/2016), Disp: 1 Inhaler, Rfl: 0   thyroid (NP THYROID) 15 MG tablet, Take 1 tablet by mouth every morning., Disp: , Rfl:    Vitamin D-Vitamin K (VITAMIN K2-VITAMIN D3 PO), 1 tablet daily., Disp: , Rfl:  Allergies  Allergen Reactions   Morphine And Codeine Shortness Of Breath   Family History  Problem Relation Age of Onset   Anemia Mother    Irritable bowel syndrome Mother    Seizures Mother    Skin cancer Mother    Heart disease Father    Breast cancer Maternal Grandmother    Hypertension Maternal Aunt    Colon cancer Neg Hx    Esophageal cancer Neg Hx    Social History   Socioeconomic History   Marital status: Married    Spouse name: Not on file   Number of children: Not on file   Years of education: Not on file   Highest education level: Not on file  Occupational History   Not on file  Tobacco Use   Smoking status: Never   Smokeless tobacco: Never  Vaping Use   Vaping status: Never Used  Substance and Sexual Activity   Alcohol use: No   Drug use: No   Sexual activity: Not Currently  Other Topics Concern   Not on file  Social History Narrative   Not on file   Social Determinants of Health   Financial Resource Strain: Not on file  Food Insecurity: Not on file  Transportation Needs: Not on file  Physical Activity: Not on file  Stress: Not on file  Social Connections: Not on file  Intimate Partner Violence: Not on file    Physical Exam: There were no vitals filed for this visit. There is no height or weight on file to calculate BMI. GEN: NAD EYE: Sclerae anicteric ENT: MMM CV: Non-tachycardic GI: Soft, NT/ND NEURO:  Alert & Oriented x 3  Lab Results: No results for input(s): "WBC", "HGB", "HCT", "PLT" in the last 72 hours. BMET No results for input(s): "NA", "K", "CL", "CO2", "GLUCOSE", "BUN", "CREATININE", "CALCIUM" in the last 72 hours. LFT No results for input(s): "PROT",  "ALBUMIN", "AST", "ALT", "ALKPHOS", "BILITOT", "BILIDIR", "IBILI" in the last 72 hours. PT/INR No results for input(s): "LABPROT", "INR" in the last 72 hours.   Impression / Plan: This is a 42 y.o.female who presents for Colonoscopy for evaluation of alternating bowel habits of constipation/diarrhea and abdominal cramping/pain.  The risks and benefits of endoscopic evaluation/treatment were discussed with the patient and/or family; these include but are not limited to the risk of perforation, infection, bleeding, missed lesions, lack of diagnosis, severe illness requiring hospitalization, as well as anesthesia and sedation related illnesses.  The patient's history has been reviewed, patient examined, no change in status, and deemed stable for procedure.  The patient and/or family is agreeable to proceed.    Corliss Parish, MD [facility] Advanced Endoscopy Office # [phone]

## 2023-01-19 NOTE — Progress Notes (Signed)
Called to room to assist during endoscopic procedure.  Patient ID and intended procedure confirmed with present staff. Received instructions for my participation in the procedure from the performing physician.  

## 2023-01-19 NOTE — Op Note (Signed)
Endoscopy Center Patient Name: [name] Procedure Date: 01/19/2023 10:49 AM MRN: [id number] Endoscopist: Corliss Parish , MD, [id number] Age: 42 Referring MD:  Date of Birth: 06/23/1980 Gender: Female Account #: [id number] Procedure:                Colonoscopy Indications:              Incidental change in bowel habits noted, Incidental                            constipation noted, Incidental diarrhea noted Medicines:                Monitored Anesthesia Care Procedure:                Pre-Anesthesia Assessment:                           - Prior to the procedure, a History and Physical                            was performed, and patient medications and                            allergies were reviewed. The patient's tolerance of                            previous anesthesia was also reviewed. The risks                            and benefits of the procedure and the sedation                            options and risks were discussed with the patient.                            All questions were answered, and informed consent                            was obtained. Prior Anticoagulants: The patient has                            taken no anticoagulant or antiplatelet agents. ASA                            Grade Assessment: II - A patient with mild systemic                            disease. After reviewing the risks and benefits,                            the patient was deemed in satisfactory condition to                            undergo the procedure.  After obtaining informed consent, the colonoscope                            was passed under direct vision. Throughout the                            procedure, the patient's blood pressure, pulse, and                            oxygen saturations were monitored continuously. The                            CF [id number] #[id number] was introduced through the anus                             and advanced to the 3 cm into the ileum. The                            colonoscopy was performed without difficulty. The                            patient tolerated the procedure. The quality of the                            bowel preparation was good. The terminal ileum,                            ileocecal valve, appendiceal orifice, and rectum                            were photographed. Scope In: 11:05:28 AM Scope Out: 11:18:29 AM Scope Withdrawal Time: 0 hours 10 minutes 11 seconds  Total Procedure Duration: 0 hours 13 minutes 1 second  Findings:                 The digital rectal exam findings include                            hemorrhoids. Pertinent negatives include no                            palpable rectal lesions.                           The terminal ileum and ileocecal valve appeared                            normal.                           A 3 mm polyp was found in the recto-sigmoid colon.                            The polyp was sessile. The polyp was removed with a  cold snare. Resection and retrieval were complete.                           Normal mucosa was found in the entire colon                            otherwise.                           Non-bleeding non-thrombosed internal hemorrhoids                            were found during retroflexion, during perianal                            exam and during digital exam. The hemorrhoids were                            Grade II (internal hemorrhoids that prolapse but                            reduce spontaneously). Complications:            No immediate complications. Estimated Blood Loss:     Estimated blood loss was minimal. Impression:               - Hemorrhoids found on digital rectal exam.                           - The examined portion of the ileum was normal.                           - One 3 mm polyp at the recto-sigmoid colon,                            removed with a  cold snare. Resected and retrieved.                           - Normal mucosa in the entire examined colon                            otherwise.                           - Non-bleeding non-thrombosed internal hemorrhoids. Recommendation:           - The patient will be observed post-procedure,                            until all discharge criteria are met.                           - Discharge patient to home.                           - Patient has a contact number available for  emergencies. The signs and symptoms of potential                            delayed complications were discussed with the                            patient. Return to normal activities tomorrow.                            Written discharge instructions were provided to the                            patient.                           - High fiber diet.                           - Use FiberCon 1-2 tablets PO daily.                           - Continue present medications.                           - Await pathology results.                           - Repeat colonoscopy in 5-10 years for surveillance                            based on pathology results and findings of                            adenomatous tissue.                           -Recommend consideration of Linzess or Trulance in                            an effort of trying to keep bowels more regularly                            so that an overflow diarrhea does not develop                            though based on patient's symptoms this more likely                            an IBS?"C/D.                           - The findings and recommendations were discussed                            with the patient.                           -  The findings and recommendations were discussed                            with the patient's family. Corliss Parish, MD 01/19/2023 11:23:40 AM

## 2023-01-19 NOTE — Patient Instructions (Signed)
Handouts Provided:  Polyps and High Fiber Diet  Use FiberCon 1-2 tablets by mouth daily.  YOU HAD AN ENDOSCOPIC PROCEDURE TODAY AT THE [facility]:   Refer to the procedure report that was given to you for any specific questions about what was found during the examination.  If the procedure report does not answer your questions, please call your gastroenterologist to clarify.  If you requested that your care partner not be given the details of your procedure findings, then the procedure report has been included in a sealed envelope for you to review at your convenience later.  YOU SHOULD EXPECT: Some feelings of bloating in the abdomen. Passage of more gas than usual.  Walking can help get rid of the air that was put into your GI tract during the procedure and reduce the bloating. If you had a lower endoscopy (such as a colonoscopy or flexible sigmoidoscopy) you may notice spotting of blood in your stool or on the toilet paper. If you underwent a bowel prep for your procedure, you may not have a normal bowel movement for a few days.  Please Note:  You might notice some irritation and congestion in your nose or some drainage.  This is from the oxygen used during your procedure.  There is no need for concern and it should clear up in a day or so.  SYMPTOMS TO REPORT IMMEDIATELY:  Following lower endoscopy (colonoscopy or flexible sigmoidoscopy):  Excessive amounts of blood in the stool  Significant tenderness or worsening of abdominal pains  Swelling of the abdomen that is new, acute  Fever of 100F or higher  For urgent or emergent issues, a gastroenterologist can be reached at any hour by calling [phone]. Do not use MyChart messaging for urgent concerns.    DIET:  We do recommend a small meal at first, but then you may proceed to your regular diet.  Drink plenty of fluids but you should avoid alcoholic beverages for 24 hours.  ACTIVITY:  You should plan to take it easy for  the rest of today and you should NOT DRIVE or use heavy machinery until tomorrow (because of the sedation medicines used during the test).    FOLLOW UP: Our staff will call the number listed on your records the next business day following your procedure.  We will call around 7:15- 8:00 am to check on you and address any questions or concerns that you may have regarding the information given to you following your procedure. If we do not reach you, we will leave a message.     If any biopsies were taken you will be contacted by phone or by letter within the next 1-3 weeks.  Please call us at [phone] if you have not heard about the biopsies in 3 weeks.    SIGNATURES/CONFIDENTIALITY: You and/or your care partner have signed paperwork which will be entered into your electronic medical record.  These signatures attest to the fact that that the information above on your After Visit Summary has been reviewed and is understood.  Full responsibility of the confidentiality of this discharge information lies with you and/or your care-partner.

## 2023-01-22 ENCOUNTER — Telehealth: Payer: Self-pay

## 2023-01-22 NOTE — Telephone Encounter (Signed)
  Follow up Call-     01/19/2023   10:19 AM  Call back number  Post procedure Call Back phone  # [phone]  Permission to leave phone message Yes     Patient questions:  Do you have a fever, pain , or abdominal swelling? No. Pain Score  0 *  Have you tolerated food without any problems? Yes.    Have you been able to return to your normal activities? Yes.    Do you have any questions about your discharge instructions: Diet   No. Medications  No. Follow up visit  No.  Do you have questions or concerns about your Care? No.  Actions: * If pain score is 4 or above: No action needed, pain <4.

## 2023-01-23 ENCOUNTER — Encounter: Payer: Self-pay | Admitting: Gastroenterology

## 2023-01-23 LAB — SURGICAL PATHOLOGY

## 2023-02-09 DIAGNOSIS — U099 Post covid-19 condition, unspecified: Secondary | ICD-10-CM | POA: Diagnosis not present

## 2023-02-09 DIAGNOSIS — E538 Deficiency of other specified B group vitamins: Secondary | ICD-10-CM | POA: Diagnosis not present

## 2023-02-09 DIAGNOSIS — R7982 Elevated C-reactive protein (CRP): Secondary | ICD-10-CM | POA: Diagnosis not present

## 2023-02-09 DIAGNOSIS — E349 Endocrine disorder, unspecified: Secondary | ICD-10-CM | POA: Diagnosis not present

## 2023-02-09 DIAGNOSIS — E039 Hypothyroidism, unspecified: Secondary | ICD-10-CM | POA: Diagnosis not present

## 2023-02-09 DIAGNOSIS — A09 Infectious gastroenteritis and colitis, unspecified: Secondary | ICD-10-CM | POA: Diagnosis not present

## 2023-02-14 DIAGNOSIS — F411 Generalized anxiety disorder: Secondary | ICD-10-CM | POA: Diagnosis not present

## 2023-02-15 DIAGNOSIS — U099 Post covid-19 condition, unspecified: Secondary | ICD-10-CM | POA: Diagnosis not present

## 2023-02-15 DIAGNOSIS — R7982 Elevated C-reactive protein (CRP): Secondary | ICD-10-CM | POA: Diagnosis not present

## 2023-02-15 DIAGNOSIS — A09 Infectious gastroenteritis and colitis, unspecified: Secondary | ICD-10-CM | POA: Diagnosis not present

## 2023-02-15 DIAGNOSIS — E039 Hypothyroidism, unspecified: Secondary | ICD-10-CM | POA: Diagnosis not present

## 2023-03-14 DIAGNOSIS — F411 Generalized anxiety disorder: Secondary | ICD-10-CM | POA: Diagnosis not present

## 2023-04-03 DIAGNOSIS — F411 Generalized anxiety disorder: Secondary | ICD-10-CM | POA: Diagnosis not present

## 2023-04-23 DIAGNOSIS — F411 Generalized anxiety disorder: Secondary | ICD-10-CM | POA: Diagnosis not present

## 2023-04-26 DIAGNOSIS — D225 Melanocytic nevi of trunk: Secondary | ICD-10-CM | POA: Diagnosis not present

## 2023-04-26 DIAGNOSIS — D2371 Other benign neoplasm of skin of right lower limb, including hip: Secondary | ICD-10-CM | POA: Diagnosis not present

## 2023-04-26 DIAGNOSIS — D2239 Melanocytic nevi of other parts of face: Secondary | ICD-10-CM | POA: Diagnosis not present

## 2023-04-26 DIAGNOSIS — L578 Other skin changes due to chronic exposure to nonionizing radiation: Secondary | ICD-10-CM | POA: Diagnosis not present

## 2023-06-06 DIAGNOSIS — F411 Generalized anxiety disorder: Secondary | ICD-10-CM | POA: Diagnosis not present

## 2023-07-11 DIAGNOSIS — F411 Generalized anxiety disorder: Secondary | ICD-10-CM | POA: Diagnosis not present

## 2023-08-07 DIAGNOSIS — U099 Post covid-19 condition, unspecified: Secondary | ICD-10-CM | POA: Diagnosis not present

## 2023-08-07 DIAGNOSIS — R7982 Elevated C-reactive protein (CRP): Secondary | ICD-10-CM | POA: Diagnosis not present

## 2023-08-07 DIAGNOSIS — K5900 Constipation, unspecified: Secondary | ICD-10-CM | POA: Diagnosis not present

## 2023-08-07 DIAGNOSIS — E538 Deficiency of other specified B group vitamins: Secondary | ICD-10-CM | POA: Diagnosis not present

## 2023-08-07 DIAGNOSIS — G4709 Other insomnia: Secondary | ICD-10-CM | POA: Diagnosis not present

## 2023-08-07 DIAGNOSIS — R7989 Other specified abnormal findings of blood chemistry: Secondary | ICD-10-CM | POA: Diagnosis not present

## 2023-08-07 DIAGNOSIS — E039 Hypothyroidism, unspecified: Secondary | ICD-10-CM | POA: Diagnosis not present

## 2023-08-07 DIAGNOSIS — E559 Vitamin D deficiency, unspecified: Secondary | ICD-10-CM | POA: Diagnosis not present

## 2023-08-07 DIAGNOSIS — A09 Infectious gastroenteritis and colitis, unspecified: Secondary | ICD-10-CM | POA: Diagnosis not present

## 2023-08-08 DIAGNOSIS — F411 Generalized anxiety disorder: Secondary | ICD-10-CM | POA: Diagnosis not present

## 2023-08-16 DIAGNOSIS — A09 Infectious gastroenteritis and colitis, unspecified: Secondary | ICD-10-CM | POA: Diagnosis not present

## 2023-08-16 DIAGNOSIS — U099 Post covid-19 condition, unspecified: Secondary | ICD-10-CM | POA: Diagnosis not present

## 2023-08-16 DIAGNOSIS — R7982 Elevated C-reactive protein (CRP): Secondary | ICD-10-CM | POA: Diagnosis not present

## 2023-08-16 DIAGNOSIS — E039 Hypothyroidism, unspecified: Secondary | ICD-10-CM | POA: Diagnosis not present

## 2023-08-29 DIAGNOSIS — F411 Generalized anxiety disorder: Secondary | ICD-10-CM | POA: Diagnosis not present

## 2023-09-13 DIAGNOSIS — U099 Post covid-19 condition, unspecified: Secondary | ICD-10-CM | POA: Diagnosis not present

## 2023-09-13 DIAGNOSIS — N920 Excessive and frequent menstruation with regular cycle: Secondary | ICD-10-CM | POA: Diagnosis not present

## 2023-09-13 DIAGNOSIS — N959 Unspecified menopausal and perimenopausal disorder: Secondary | ICD-10-CM | POA: Diagnosis not present

## 2023-09-13 DIAGNOSIS — R5383 Other fatigue: Secondary | ICD-10-CM | POA: Diagnosis not present

## 2023-09-19 DIAGNOSIS — F411 Generalized anxiety disorder: Secondary | ICD-10-CM | POA: Diagnosis not present

## 2023-10-02 DIAGNOSIS — F411 Generalized anxiety disorder: Secondary | ICD-10-CM | POA: Diagnosis not present

## 2023-10-22 DIAGNOSIS — F331 Major depressive disorder, recurrent, moderate: Secondary | ICD-10-CM | POA: Diagnosis not present

## 2023-11-19 ENCOUNTER — Encounter: Payer: Self-pay | Admitting: Podiatry

## 2023-11-19 ENCOUNTER — Ambulatory Visit (INDEPENDENT_AMBULATORY_CARE_PROVIDER_SITE_OTHER): Payer: Self-pay | Admitting: Podiatry

## 2023-11-19 ENCOUNTER — Ambulatory Visit (INDEPENDENT_AMBULATORY_CARE_PROVIDER_SITE_OTHER)

## 2023-11-19 DIAGNOSIS — M722 Plantar fascial fibromatosis: Secondary | ICD-10-CM | POA: Diagnosis not present

## 2023-11-19 MED ORDER — MELOXICAM 15 MG PO TABS: 15.0000 mg | ORAL_TABLET | Freq: Every day | ORAL | 0 refills | Status: DC

## 2023-11-19 NOTE — Patient Instructions (Signed)

## 2023-11-19 NOTE — Progress Notes (Signed)
  Subjective:  Patient ID: [name], female    DOB: 05-26-1980,   MRN: [id number]  Chief Complaint  Patient presents with   Foot Pain    I have a bump on the bottom of my foot (arch).  My foot hurts when I walk on it.  My heel is kind of tender in the front.    43 y.o. female presents for concern of right foot pain that has been ongoing for several months. Relates a history of a bump on the bottom of her foot that does not typically bother her. She does relate increasing her running lately. She has tried supportive shoes including Hoka and Oofos. Relates first steps are very painful. She also has a beach trip coming up that she would like to feel better for.  Denies any other pedal complaints. Denies n/v/f/c.   Past Medical History:  Diagnosis Date   Allergic rhinitis    Arthritis    Asthma    Cough    History of gallstones    Thyroid  disease     Objective:  Physical Exam: Vascular: DP/PT pulses 2/4 bilateral. CFT <3 seconds. Normal hair growth on digits. No edema.  Skin. No lacerations or abrasions bilateral feet.  Musculoskeletal: MMT 5/5 bilateral lower extremities in DF, PF, Inversion and Eversion. Deceased ROM in DF of ankle joint. Tender to the medial calcaneal tubercle right . No pain with achilles, PT or arch. No pain with calcaneal squeeze.  Neurological: Sensation intact to light touch.   Assessment:   1. Plantar fasciitis      Plan:  Patient was evaluated and treated and all questions answered. Discussed plantar fasciitis with patient.  X-rays reviewed and discussed with patient. No acute fractures or dislocations noted. Mild spurring noted at inferior calcaneus.  Discussed treatment options including, ice, NSAIDS, supportive shoes, bracing, and stretching. Stretching exercises provided to be done on a daily basis.   Prescription for meloxicam  provided and sent to pharmacy. PF brace dispensed.  Follow-up 6 weeks or sooner if any problems arise. In the  meantime, encouraged to call the office with any questions, concerns, change in symptoms.     Asberry Failing, DPM

## 2023-11-26 DIAGNOSIS — F331 Major depressive disorder, recurrent, moderate: Secondary | ICD-10-CM | POA: Diagnosis not present

## 2023-12-17 ENCOUNTER — Other Ambulatory Visit: Payer: Self-pay | Admitting: Podiatry

## 2023-12-19 DIAGNOSIS — F331 Major depressive disorder, recurrent, moderate: Secondary | ICD-10-CM | POA: Diagnosis not present

## 2023-12-31 ENCOUNTER — Ambulatory Visit: Admitting: Podiatry

## 2024-01-09 DIAGNOSIS — F331 Major depressive disorder, recurrent, moderate: Secondary | ICD-10-CM | POA: Diagnosis not present

## 2024-01-24 DIAGNOSIS — E349 Endocrine disorder, unspecified: Secondary | ICD-10-CM | POA: Diagnosis not present

## 2024-01-24 DIAGNOSIS — U099 Post covid-19 condition, unspecified: Secondary | ICD-10-CM | POA: Diagnosis not present

## 2024-01-24 DIAGNOSIS — R7982 Elevated C-reactive protein (CRP): Secondary | ICD-10-CM | POA: Diagnosis not present

## 2024-01-24 DIAGNOSIS — A09 Infectious gastroenteritis and colitis, unspecified: Secondary | ICD-10-CM | POA: Diagnosis not present

## 2024-01-24 DIAGNOSIS — E039 Hypothyroidism, unspecified: Secondary | ICD-10-CM | POA: Diagnosis not present

## 2024-01-25 DIAGNOSIS — K582 Mixed irritable bowel syndrome: Secondary | ICD-10-CM | POA: Diagnosis not present

## 2024-01-25 DIAGNOSIS — Z13220 Encounter for screening for lipoid disorders: Secondary | ICD-10-CM | POA: Diagnosis not present

## 2024-01-25 DIAGNOSIS — E049 Nontoxic goiter, unspecified: Secondary | ICD-10-CM | POA: Diagnosis not present

## 2024-01-25 DIAGNOSIS — E66811 Obesity, class 1: Secondary | ICD-10-CM | POA: Diagnosis not present

## 2024-01-25 DIAGNOSIS — Z131 Encounter for screening for diabetes mellitus: Secondary | ICD-10-CM | POA: Diagnosis not present

## 2024-01-25 DIAGNOSIS — Z0000 Encounter for general adult medical examination without abnormal findings: Secondary | ICD-10-CM | POA: Diagnosis not present

## 2024-01-25 DIAGNOSIS — R0789 Other chest pain: Secondary | ICD-10-CM | POA: Diagnosis not present

## 2024-01-30 DIAGNOSIS — F331 Major depressive disorder, recurrent, moderate: Secondary | ICD-10-CM | POA: Diagnosis not present

## 2024-01-30 NOTE — Progress Notes (Unsigned)
 Cardiology Clinic Note   Patient Name: [name] Date of Encounter: 02/01/2024  Primary Care Provider:  Lennice Mliss NOVAK, PA Primary Cardiologist:  None  Patient Profile    [name] 43 year old female presents the clinic today for evaluation of her chest discomfort and abnormal heart sounds.  Past Medical History    Past Medical History:  Diagnosis Date   Allergic rhinitis    Arthritis    Asthma    Cough    History of gallstones    Thyroid  disease    Past Surgical History:  Procedure Laterality Date   CESAREAN SECTION N/A 05/12/2014   Procedure: CESAREAN SECTION;  Surgeon: Truman Corona, MD;  Location: [facility];  Service: Obstetrics;  Laterality: N/A;   CESAREAN SECTION N/A 01/28/2016   Procedure: CESAREAN SECTION;  Surgeon: Burnard Bowers, MD;  Location: [facility];  Service: Obstetrics;  Laterality: N/A;   CESAREAN SECTION CLASSICAL  05/08/2010   CHOLECYSTECTOMY  05/08/2008   COLONOSCOPY  01/19/2023   polyp-rectosigmoid, Aloha Mansouraty at [facility]   DILATION AND EVACUATION N/A 05/16/2013   Procedure: DILATATION AND EVACUATION;  Surgeon: Truman Corona, MD;  Location: [facility];  Service: Gynecology;  Laterality: N/A;   ESOPHAGOGASTRODUODENOSCOPY     over 15 years ago   KNEE SURGERY Left    WISDOM TOOTH EXTRACTION  05/09/1995    Allergies  Allergies  Allergen Reactions   Morphine  And Codeine Shortness Of Breath    History of Present Illness    [name] has PMH of allergic rhinitis, asthma, cough, history of gallstones, and thyroid  disease.  She has a family history of heart disease and both paternal and maternal grandparents.  She reports that her maternal grandfather had heart attack.  She notes that her aunt has atrial fibrillation and had both cardioversion and ablation procedures.[name]  He was seen and evaluated by her PCP on 01/25/2024.  She reported intermittent episodes of chest pain that had been present since she was in  her 80s.  She also was noted to have irregular heart rhythm.  EKG showed normal sinus rhythm.    She presents to the clinic today for initial evaluation and states her episodes have been intermittent and brief since she was in her 34s.  She had less instances of palpitations and chest discomfort while she was in her 30s.  She describes her episodes as sharp and lasting for 30 seconds to 2 minutes.  She notes that she could have 1-2 episodes per month and that they are occasionally more frequent.  She notices them both with being at rest and on occasion with physical activity.  She ports that she had an episode yesterday while she was talking on the phone.  The episodes dissipate with rest.  Her blood pressure today is 128/68.  Her EKG shows sinus rhythm with a rate of 88 bpm.  She is physically active and uses a walking pad at work.  She occasionally does resistance training and also enjoys swimming.  She feels that she walks about a mile per day.  We reviewed her lab work from her PCP.  I will order a magnesium level and 30-day cardiac event monitor.  We will plan follow-up in 2 to 3 months.  I also reviewed triggers for palpitations and asked her to avoid caffeine, chocolate, EtOH, dehydration as well.  Today she denies chest pain, shortness of breath, lower extremity edema, fatigue, palpitations, melena, hematuria, hemoptysis, diaphoresis, weakness, presyncope, syncope, orthopnea, and  PND.    Home Medications    Prior to Admission medications   Medication Sig Start Date End Date Taking? Authorizing Provider  Alpha-Lipoic Acid 600 MG CAPS 1 capsule daily. 03/07/22   [provider]  AMBULATORY NON FORMULARY MEDICATION 1 capsule daily. JUICE PLUS OMEGA BLEND Patient not taking: Reported on 11/19/2023 10/06/17   [provider]  AMBULATORY NON FORMULARY MEDICATION 500 mg 2 (two) times daily. Medication Name: ESTROGEN CONTROL Patient not taking: Reported on 11/19/2023    [provider]  AMBULATORY NON FORMULARY MEDICATION 1 capsule daily. Medication Name: BIO-QUERCETIN Patient not taking: Reported on 11/19/2023    [provider]  AMBULATORY NON FORMULARY MEDICATION 10 mg daily in the afternoon. Medication Name: SPERMIDINE Patient not taking: Reported on 11/19/2023    [provider]  B Complex Vitamins (B COMPLEX PO) 1 tablet daily. 03/07/22   [provider]  meloxicam  (MOBIC ) 15 MG tablet TAKE 1 TABLET (15 MG TOTAL) BY MOUTH DAILY. 12/19/23   Joya Stabs, DPM  Misc Natural Products (CORTISOL MANAGER PO)  08/05/22   [provider]  Misc Natural Products (NF FORMULAS TESTOSTERONE  PO) Take 2.5 mg by mouth 2 (two) times daily. Sublingular    [provider]  Prasterone, DHEA, (DHEA 50 PO) 1 capsule daily. Patient not taking: Reported on 11/19/2023 03/07/22   [provider]  Probiotic Product (PROBIOTIC PO) Take 1 tablet by mouth daily.    [provider]  PROGESTERONE  PO 200 mg daily. 03/07/22   [provider]  thyroid  (NP THYROID ) 15 MG tablet Take 1 tablet by mouth every morning.    [provider]  Vitamin D-Vitamin K (VITAMIN K2-VITAMIN D3 PO) 1 tablet daily. 03/07/22   [provider]    Family History    Family History  Problem Relation Age of Onset   Anemia Mother    Irritable bowel syndrome Mother    Seizures Mother    Skin cancer Mother    Heart disease Father    Breast cancer Maternal Grandmother    Hypertension Maternal Aunt    Colon cancer Neg Hx    Esophageal cancer Neg Hx    She indicated that the status of her mother is unknown. She indicated that the status of her father is unknown. She indicated that the status of her maternal grandmother is unknown. She indicated that the status of her maternal aunt is unknown. She indicated that the status of her neg hx is unknown.  Social History    Social History   Socioeconomic History   Marital  status: Married    Spouse name: Not on file   Number of children: Not on file   Years of education: Not on file   Highest education level: Not on file  Occupational History   Not on file  Tobacco Use   Smoking status: Never   Smokeless tobacco: Never  Vaping Use   Vaping status: Never Used  Substance and Sexual Activity   Alcohol use: No   Drug use: No   Sexual activity: Not Currently  Other Topics Concern   Not on file  Social History Narrative   Not on file   Social Drivers of Health   Financial Resource Strain: Not on file  Food Insecurity: Not on file  Transportation Needs: Not on file  Physical Activity: Not on file  Stress: Not on file  Social Connections: Not on file  Intimate Partner Violence: Not on file  Review of Systems    General:  No chills, fever, night sweats or weight changes.  Cardiovascular:  No chest pain, dyspnea on exertion, edema, orthopnea, palpitations, paroxysmal nocturnal dyspnea. Dermatological: No rash, lesions/masses Respiratory: No cough, dyspnea Urologic: No hematuria, dysuria Abdominal:   No nausea, vomiting, diarrhea, bright red blood per rectum, melena, or hematemesis Neurologic:  No visual changes, wkns, changes in mental status. All other systems reviewed and are otherwise negative except as noted above.  Physical Exam    VS:  BP 128/68   Pulse 88   Ht 6' (1.829 m)   Wt 220 lb (99.8 kg)   SpO2 97%   BMI 29.84 kg/m  , BMI Body mass index is 29.84 kg/m. GEN: Well nourished, well developed, in no acute distress. HEENT: normal. Neck: Supple, no JVD, carotid bruits, or masses. Cardiac: RRR, occasional irregular heartbeat not evident on EKG, no murmurs, rubs, or gallops. No clubbing, cyanosis, edema.  Radials/DP/PT 2+ and equal bilaterally.  Respiratory:  Respirations regular and unlabored, clear to auscultation bilaterally. GI: Soft, nontender, nondistended, BS + x 4. MS: no deformity or atrophy. Skin: warm and dry, no  rash. Neuro:  Strength and sensation are intact. Psych: Normal affect.  Accessory Clinical Findings    Recent Labs: No results found for requested labs within last 365 days.   Recent Lipid Panel No results found for: CHOL, TRIG, HDL, CHOLHDL, VLDL, LDLCALC, LDLDIRECT       ECG personally reviewed by me today- EKG Interpretation Date/Time:  Friday February 01 2024 09:06:09 EDT Ventricular Rate:  88 PR Interval:  116 QRS Duration:  80 QT Interval:  364 QTC Calculation: 440 R Axis:   76  Text Interpretation: Normal sinus rhythm Normal ECG No previous ECGs available Confirmed by Emelia Hazy [phone]) on 02/01/2024 9:13:14 AM         Assessment & Plan   1.  Chest discomfort-no chest pain today.  Reports intermittent episodes of brief chest discomfort since she was in her 15s.  She remains physically active.  Her episodes are brief and intermittent.  Reviewed options for further prognostication.  Reports episode yesterday while she was talking on the phone.  May have an episode 1-2 times per month and occasionally more frequently. Order a 30-day cardiac event monitor Order magnesium Heart healthy low-sodium diet Maintain physical activity-goal 150 minutes of moderate physical activity per week  Palpitations-EKG today shows normal sinus rhythm 88 bpm.  Denies palpitations today.  Denies lightheadedness, presyncope or syncope.  Denies shortness of breath.  Avoid triggers caffeine, chocolate, EtOH, dehydration excetra. Order 30-day cardiac event monitor Order magnesium  Disposition: Follow-up with Dr. Loni or me in 2-3 months.   Hazy HERO. Nakisha Chai NP-C     02/01/2024, 9:39 AM [facility] [address] [place], [place] [address] Office [phone]    Notice: This dictation was prepared with Dragon dictation along with smaller phrase technology. Any transcriptional errors that result from this process are  unintentional and may not be corrected upon review.   I spent 14 minutes examining this patient, reviewing medications, and using patient centered shared decision making involving their cardiac care.   I spent  20 minutes reviewing past medical history,  medications, and prior cardiac tests.

## 2024-02-01 ENCOUNTER — Ambulatory Visit: Attending: General Practice | Admitting: General Practice

## 2024-02-01 ENCOUNTER — Encounter: Payer: Self-pay | Admitting: General Practice

## 2024-02-01 VITALS — BP 128/68 | HR 88 | Ht 72.0 in | Wt 220.0 lb

## 2024-02-01 DIAGNOSIS — R002 Palpitations: Secondary | ICD-10-CM | POA: Diagnosis not present

## 2024-02-01 DIAGNOSIS — R079 Chest pain, unspecified: Secondary | ICD-10-CM

## 2024-02-01 NOTE — Patient Instructions (Signed)
 Medication Instructions:   Your physician recommends that you continue on your current medications as directed. Please refer to the Current Medication list given to you today.   *If you need a refill on your cardiac medications before your next appointment, please call your pharmacy*   Lab Work:   PLEASE GO DOWN STAIRS  [facility]  FIRST FLOOR   ( GET OFF ELEVATORS WALK TOWARDS WAITING AREA LAB LOCATED BY PHARMACY):  MAG TODAY     If you have labs (blood work) drawn today and your tests are completely normal, you will receive your results only by: MyChart Message (if you have MyChart) OR A paper copy in the mail If you have any lab test that is abnormal or we need to change your treatment, we will call you to review the results.   Testing/Procedures: Your physician has recommended that you wear an event monitor. Event monitors are medical devices that record the heart's electrical activity. Doctors most often us  these monitors to diagnose arrhythmias. Arrhythmias are problems with the speed or rhythm of the heartbeat. The monitor is a small, portable device. You can wear one while you do your normal daily activities. This is usually used to diagnose what is causing palpitations/syncope (passing out).      Follow-Up: At [facility], you and your health needs are our priority.  As part of our continuing mission to provide you with exceptional heart care, our providers are all part of one team.  This team includes your primary Cardiologist (physician) and Advanced Practice Providers or APPs (Physician Assistants and Nurse Practitioners) who all work together to provide you with the care you need, when you need it.  Your next appointment:  2 -3  month(s)   Provider:  Josefa Beauvais NP       We recommend signing up for the patient portal called MyChart.  Sign up information is provided on this After Visit Summary.  MyChart is used to connect with patients for Virtual Visits  (Telemedicine).  Patients are able to view lab/test results, encounter notes, upcoming appointments, etc.  Non-urgent messages can be sent to your provider as well.   To learn more about what you can do with MyChart, go to [email or web address].   Other Instructions      [employer] Cardiac Event Monitor Instructions  Your physician has requested you wear your cardiac event monitor for ___30__ days, (1-30). [employer] may call or text to confirm a shipping address. The monitor will be sent to a land address via UPS. [employer] will not ship a monitor to a PO BOX. It typically takes 3-5 days to receive your monitor after it has been enrolled. [employer] will assist with USPS tracking if your package is delayed. The telephone number for [employer] is [phone]. Once you have received your monitor, please review the enclosed instructions. Instruction tutorials can also be viewed under help and settings on the enclosed cell phone. Your monitor has already been registered assigning a specific monitor serial # to you.  Billing and Self Pay Discount Information  [employer] has been provided the insurance information we had on file for you.  If your insurance has been updated, please call [employer] at [phone] to provide them with your updated insurance information.   [employer] offers a discounted Self Pay option for patients who have insurance that does not cover their cardiac event monitor or patients without insurance.  The discounted cost of a Self Pay Cardiac Event Monitor would be $225.00 , if  the patient contacts [employer] at [phone] within 7 days of applying the monitor to make payment arrangements.  If the patient does not contact [employer] within 7 days of applying the monitor, the cost of the cardiac event monitor will be $350.00.  Applying the monitor  Remove cell phone from case and turn it on. The cell phone works as IT consultant and needs to be within [employer]  of you at all times. The cell phone will need to be charged on a daily basis. We recommend you plug the cell phone into the enclosed charger at your bedside table every night.  Monitor batteries: You will receive two monitor batteries labelled #1 and #2. These are your recorders. Plug battery #2 onto the second connection on the enclosed charger. Keep one battery on the charger at all times. This will keep the monitor battery deactivated. It will also keep it fully charged for when you need to switch your monitor batteries. A small light will be blinking on the battery emblem when it is charging. The light on the battery emblem will remain on when the battery is fully charged.  Open package of a Monitor strip. Insert battery #1 into black hood on strip and gently squeeze monitor battery onto connection as indicated in instruction booklet. Set aside while preparing skin.  Choose location for your strip, vertical or horizontal, as indicated in the instruction booklet. Shave to remove all hair from location. There cannot be any lotions, oils, powders, or colognes on skin where monitor is to be applied. Wipe skin clean with enclosed Saline wipe. Dry skin completely.  Peel paper labeled #1 off the back of the Monitor strip exposing the adhesive. Place the monitor on the chest in the vertical or horizontal position shown in the instruction booklet. One arrow on the monitor strip must be pointing upward. Carefully remove paper labeled #2, attaching remainder of strip to your skin. Try not to create any folds or wrinkles in the strip as you apply it.  Firmly press and release the circle in the center of the monitor battery. You will hear a small beep. This is turning the monitor battery on. The heart emblem on the monitor battery will light up every 5 seconds if the monitor battery in turned on and connected to the patient securely. Do not push and hold the circle down as this turns the monitor  battery off. The cell phone will locate the monitor battery. A screen will appear on the cell phone checking the connection of your monitor strip. This may read poor connection initially but change to good connection within the next minute. Once your monitor accepts the connection you will hear a series of 3 beeps followed by a climbing crescendo of beeps. A screen will appear on the cell phone showing the two monitor strip placement options. Touch the picture that demonstrates where you applied the monitor strip.  Your monitor strip and battery are waterproof. You are able to shower, bathe, or swim with the monitor on. They just ask you do not submerge deeper than 3 feet underwater. We recommend removing the monitor if you are swimming in a lake, river, or ocean.  Your monitor battery will need to be switched to a fully charged monitor battery approximately once a week. The cell phone will alert you of an action which needs to be made.  On the cell phone, tap for details to reveal connection status, monitor battery status, and cell phone battery status. The green  dots indicates your monitor is in good status. A red dot indicates there is something that needs your attention.  To record a symptom, click the circle on the monitor battery. In 30-60 seconds a list of symptoms will appear on the cell phone. Select your symptom and tap save. Your monitor will record a sustained or significant arrhythmia regardless of you clicking the button. Some patients do not feel the heart rhythm irregularities. [employer] will notify us  of any serious or critical events.  Refer to instruction booklet for instructions on switching batteries, changing strips, the Do not disturb or Pause features, or any additional questions.  Call [employer] at [phone], to confirm your monitor is transmitting and record your baseline. They will answer any questions you may have regarding the monitor instructions at that  time.  Returning the monitor to [employer]  Place all equipment back into blue box. Peel off strip of paper to expose adhesive and close box securely. There is a prepaid UPS shipping label on this box. Drop in a UPS drop box, or at a UPS facility like [employer]. You may also contact [employer] to arrange UPS to pick up monitor package at your home.

## 2024-02-02 LAB — MAGNESIUM: Magnesium: 2 mg/dL (ref 1.6–2.3)

## 2024-02-04 ENCOUNTER — Ambulatory Visit: Payer: Self-pay | Admitting: General Practice

## 2024-02-04 NOTE — Telephone Encounter (Signed)
 Patient is returning call.

## 2024-02-06 DIAGNOSIS — R002 Palpitations: Secondary | ICD-10-CM | POA: Diagnosis not present

## 2024-02-06 DIAGNOSIS — Z1231 Encounter for screening mammogram for malignant neoplasm of breast: Secondary | ICD-10-CM | POA: Diagnosis not present

## 2024-02-06 DIAGNOSIS — B2790 Infectious mononucleosis, unspecified without complication: Secondary | ICD-10-CM | POA: Diagnosis not present

## 2024-02-13 DIAGNOSIS — F331 Major depressive disorder, recurrent, moderate: Secondary | ICD-10-CM | POA: Diagnosis not present

## 2024-02-25 ENCOUNTER — Telehealth: Payer: Self-pay

## 2024-02-25 DIAGNOSIS — E669 Obesity, unspecified: Secondary | ICD-10-CM | POA: Diagnosis not present

## 2024-02-25 DIAGNOSIS — Z6830 Body mass index (BMI) 30.0-30.9, adult: Secondary | ICD-10-CM | POA: Diagnosis not present

## 2024-02-25 NOTE — Telephone Encounter (Signed)
   Cardiac Monitor Alert  Date of alert:  02/25/2024   Patient Name: [name]  DOB: 19-Sep-1980  MRN: [id number]   [facility] Cardiologist: None  [facility] EP:  None    Monitor Information: Cardiac Event Monitor [[employer]]  Reason:  palpitations Ordering provider:  Josefa Beauvais, NP   Alert Bradycardia - slowest HR: 33 on 10/18 at 5:09am This is the 1st alert for this rhythm.   Next Cardiology Appointment   Date: 12/2   Provider:  Josefa Beauvais, NP  The patient was contacted today.  She is asymptomatic. She was asleep during this time.  Arrhythmia, symptoms and history reviewed with Dr. [name].   Plan:  continue to monitor  Juel Bellerose Chauvigne, RN  02/25/2024 8:43 AM

## 2024-03-04 ENCOUNTER — Encounter: Payer: Self-pay | Admitting: Internal Medicine

## 2024-03-10 ENCOUNTER — Ambulatory Visit: Attending: General Practice

## 2024-03-10 DIAGNOSIS — R002 Palpitations: Secondary | ICD-10-CM | POA: Diagnosis not present

## 2024-04-08 ENCOUNTER — Ambulatory Visit: Admitting: General Practice
